# Patient Record
Sex: Female | Born: 2000 | Race: White | Hispanic: No | Marital: Married | Smoking: Former smoker
Health system: Southern US, Community
[De-identification: ages and names within clinical notes are randomized; demographics above are authoritative.]

## PROBLEM LIST (undated history)

## (undated) ENCOUNTER — Emergency Department (HOSPITAL_COMMUNITY): Disposition: A | Discharge status: Home / Self Care | Payer: Medicaid Other

## (undated) DIAGNOSIS — Z87440 Personal history of urinary (tract) infections: Secondary | ICD-10-CM

## (undated) DIAGNOSIS — F419 Anxiety disorder, unspecified: Secondary | ICD-10-CM

## (undated) DIAGNOSIS — R55 Syncope and collapse: Secondary | ICD-10-CM

## (undated) DIAGNOSIS — Z8619 Personal history of other infectious and parasitic diseases: Secondary | ICD-10-CM

## (undated) DIAGNOSIS — I499 Cardiac arrhythmia, unspecified: Secondary | ICD-10-CM

## (undated) HISTORY — DX: Personal history of other infectious and parasitic diseases: Z86.19

## (undated) HISTORY — DX: Syncope and collapse: R55

## (undated) HISTORY — DX: Personal history of urinary (tract) infections: Z87.440

## (undated) HISTORY — PX: OTHER SURGICAL HISTORY: SHX169

---
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
## 2018-01-20 LAB — HM HIV SCREENING LAB: HM HIV Screening: NEGATIVE

## 2018-07-27 ENCOUNTER — Other Ambulatory Visit: Payer: Self-pay

## 2018-07-27 ENCOUNTER — Emergency Department
Admission: EM | Admit: 2018-07-27 | Discharge: 2018-07-27 | Disposition: A | Discharge status: Home / Self Care | Payer: Medicaid Other | Attending: Emergency Medicine | Admitting: Emergency Medicine

## 2018-07-27 ENCOUNTER — Encounter: Payer: Self-pay | Admitting: Intensive Care

## 2018-07-27 ENCOUNTER — Emergency Department
Admission: EM | Admit: 2018-07-27 | Discharge: 2018-07-27 | Disposition: A | Discharge status: Home / Self Care | Payer: Medicaid Other | Source: Home / Self Care | Attending: Emergency Medicine | Admitting: Emergency Medicine

## 2018-07-27 ENCOUNTER — Encounter: Payer: Self-pay | Admitting: Emergency Medicine

## 2018-07-27 DIAGNOSIS — F17290 Nicotine dependence, other tobacco product, uncomplicated: Secondary | ICD-10-CM | POA: Insufficient documentation

## 2018-07-27 DIAGNOSIS — Z79899 Other long term (current) drug therapy: Secondary | ICD-10-CM | POA: Insufficient documentation

## 2018-07-27 DIAGNOSIS — B9689 Other specified bacterial agents as the cause of diseases classified elsewhere: Secondary | ICD-10-CM

## 2018-07-27 DIAGNOSIS — N898 Other specified noninflammatory disorders of vagina: Secondary | ICD-10-CM | POA: Insufficient documentation

## 2018-07-27 DIAGNOSIS — R300 Dysuria: Secondary | ICD-10-CM | POA: Insufficient documentation

## 2018-07-27 DIAGNOSIS — Z5321 Procedure and treatment not carried out due to patient leaving prior to being seen by health care provider: Secondary | ICD-10-CM | POA: Diagnosis not present

## 2018-07-27 DIAGNOSIS — N760 Acute vaginitis: Secondary | ICD-10-CM

## 2018-07-27 LAB — URINALYSIS, COMPLETE (UACMP) WITH MICROSCOPIC
BACTERIA UA: NONE SEEN
Bilirubin Urine: NEGATIVE
GLUCOSE, UA: NEGATIVE mg/dL
KETONES UR: NEGATIVE mg/dL
Leukocytes,Ua: NEGATIVE
NITRITE: NEGATIVE
PROTEIN: NEGATIVE mg/dL
Specific Gravity, Urine: 1.024 (ref 1.005–1.030)
pH: 6 (ref 5.0–8.0)

## 2018-07-27 LAB — CHLAMYDIA/NGC RT PCR (ARMC ONLY)
Chlamydia Tr: DETECTED — AB
N gonorrhoeae: NOT DETECTED

## 2018-07-27 LAB — WET PREP, GENITAL
SPERM: NONE SEEN
Trich, Wet Prep: NONE SEEN
Yeast Wet Prep HPF POC: NONE SEEN

## 2018-07-27 LAB — POCT PREGNANCY, URINE: PREG TEST UR: NEGATIVE

## 2018-07-27 MED ORDER — METRONIDAZOLE 500 MG PO TABS: 500.0000 mg | ORAL_TABLET | Freq: Two times a day (BID) | ORAL | 0 refills | Status: AC

## 2018-07-27 MED ORDER — METRONIDAZOLE 500 MG PO TABS
500.0000 mg | ORAL_TABLET | Freq: Once | ORAL | Status: DC
  Filled 2018-07-27: qty 1

## 2018-07-27 NOTE — ED Triage Notes (Addendum)
Patient c/o lower back pain and brown discharge X3 days. Denies urinary problems. Patient gave urine yesterday. Results in review

## 2018-07-27 NOTE — ED Triage Notes (Signed)
Pt c/o pelvic pain and dark brown, thick vaginal discharge post sexual intercourse 4 days ago.

## 2018-07-27 NOTE — Discharge Instructions (Addendum)
Your exam and labs have confirmed BV. Take the prescription meds as directed. Follow-up with your provider for ongoing symptoms. You may call back for pending lab results.

## 2018-07-27 NOTE — ED Provider Notes (Signed)
[facility] Emergency Department Provider Note ____________________________________________  Time seen: 1750  I have reviewed the triage vital signs and the nursing notes.  HISTORY  Chief Complaint  Back Pain (lower)  HPI [name] is a 18 y.o. female returns to the ED for evaluation of low back pain, low abdominal pain, vaginal discharge.  Patient presented last night but was unable to stay due to the protracted wait.  She presents today with 2 to 3 days of lower abdominal pain as well as some vaginal discharge she describes as brown.  She denies any frank hematuria but has had some urinary frequency and urgency.  She reports subjective fevers, nausea without vomiting, and some mild flank pain.  Patient is sexually active and but denies any current birth control.  She reports her LMP was 1-1/2 weeks prior.  Past Medical History:  Diagnosis Date  . Irregular heartbeat   . Vasovagal syncope     Patient Active Problem List   Diagnosis Date Noted  . Trichomonal vaginitis 12/16/2016  . Preventative health care 12/13/2016  . Tobacco abuse 12/13/2016  . Needs flu shot 07/17/2015  . 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 Heart palpitations 07/17/2015  . Need for HPV vaccination 07/17/2015  . Screening for STD (sexually transmitted disease) 07/17/2015    History reviewed. No pertinent surgical history.  Prior to Admission medications   Medication Sig Start Date End Date Taking? Authorizing Provider  BIOTIN PO Take 500 mcg by mouth 2 (two) times daily.    [provider]  metroNIDAZOLE (FLAGYL) 500 MG tablet Take 1 tablet (500 mg total) by mouth 2 (two) times daily for 7 days. 07/27/18 08/03/18  Tavita Eastham, Charlesetta Ivory, PA-C  Prenatal Vit-Fe Fumarate-FA (PRENATAL MULTIVITAMIN) TABS tablet Take 1 tablet by mouth daily at 12 noon.    [provider]    Allergies Patient has no known allergies.  Family History  Problem Relation Age of Onset  .  Heart disease Paternal Uncle   . Heart disease Paternal Grandfather   . Depression Maternal Grandmother   . COPD Maternal Grandmother   . Schizophrenia Maternal Grandmother   . Lung cancer Maternal Grandfather   . Liver cancer Maternal Grandfather   . Cancer Maternal Grandfather        liver and lung   . Multiple sclerosis Paternal Grandmother     Social History Social History   Tobacco Use  . Smoking status: Current Every Day Smoker    Packs/day: 0.50    Types: Cigars  . Smokeless tobacco: Never Used  Substance Use Topics  . Alcohol use: Yes  . Drug use: No    Types: Marijuana    Review of Systems  Constitutional: Negative for fever. Eyes: Negative for visual changes. ENT: Negative for sore throat. Cardiovascular: Negative for chest pain. Respiratory: Negative for shortness of breath. Gastrointestinal: Positive for lower abdominal pain, and intermittent nausea.  Denies any vomiting and diarrhea. Genitourinary: Positive for dysuria.  Reports vaginal discharge as above. Musculoskeletal: Negative for back pain. Skin: Negative for rash. Neurological: Negative for headaches, focal weakness or numbness. ____________________________________________  PHYSICAL EXAM:  VITAL SIGNS: ED Triage Vitals [07/27/18 1607]  Enc Vitals Group     BP (!) 155/95     Pulse Rate 96     Resp 16     Temp 98.4 F (36.9 C)     Temp Source Oral     SpO2 100 %     Weight 140 lb (63.5  kg)     Height 5\' 10"  (1.778 m)     Head Circumference      Peak Flow      Pain Score 4     Pain Loc      Pain Edu?      Excl. in GC?     Constitutional: Alert and oriented. Well appearing and in no distress. Head: Normocephalic and atraumatic. Eyes: Conjunctivae are normal. Normal extraocular movements Cardiovascular: Normal rate, regular rhythm. Normal distal pulses. Respiratory: Normal respiratory effort. No wheezes/rales/rhonchi. Gastrointestinal: Soft and nontender. No distention. GU: normal  external genitalia. Brownish discharge in the vault. Cervical os is closed with mucoid discharge noted. No CMT or adnexal masses appreciated.  Musculoskeletal: Nontender with normal range of motion in all extremities.  Neurologic:  Normal gait without ataxia. Normal speech and language. No gross focal neurologic deficits are appreciated. Skin:  Skin is warm, dry and intact. No rash noted. ____________________________________________   LABS (pertinent positives/negatives)  Labs Reviewed  WET PREP, GENITAL - Abnormal; Notable for the following components:      Result Value   Clue Cells Wet Prep HPF POC PRESENT (*)    WBC, Wet Prep HPF POC MANY (*)    All other components within normal limits  CHLAMYDIA/NGC RT PCR ([facility] ONLY)  ____________________________________________  PROCEDURES  Procedures ____________________________________________  INITIAL IMPRESSION / ASSESSMENT AND PLAN / ED COURSE  Patient with ED evaluation of dysuria and discharge, presented to the ED for evaluation.  Urinalysis is reassuring at this time.  Patient's wet prep does confirm clue cells.  She will be discharged with a prescription for metronidazole to treat her BV.  She is encouraged to call back in 2 hours to confirm the results of her pending GC test.  ____________________________________________  FINAL CLINICAL IMPRESSION(S) / ED DIAGNOSES  Final diagnoses:  Dysuria  BV (bacterial vaginosis)      Karmen Stabs, Charlesetta Ivory, PA-C 07/27/18 1921    Phineas Semen, MD 07/27/18 2141

## 2018-07-28 ENCOUNTER — Telehealth: Payer: Self-pay | Admitting: Emergency Medicine

## 2018-07-28 NOTE — Telephone Encounter (Addendum)
Called patient to inform of positive chlamydia test and need for treatment.  Mobile number no answer and no voicmail.  Home phone--left message asking her to call me.  Per dr. Mayford Knife can call in azithromycin 1 gram to patient preferred pharmacy.  07/29/2018--tried mobile number again and still no answer and no voicemail.  She has not responded to home phone message.  Will send letter.

## 2018-11-23 ENCOUNTER — Ambulatory Visit: Payer: Medicaid Other

## 2019-11-14 ENCOUNTER — Emergency Department
Admission: EM | Admit: 2019-11-14 | Discharge: 2019-11-14 | Discharge status: Against Medical Advice | Payer: Medicaid Other | Attending: Emergency Medicine | Admitting: Emergency Medicine

## 2019-11-14 ENCOUNTER — Other Ambulatory Visit: Payer: Self-pay

## 2019-11-14 DIAGNOSIS — T40601A Poisoning by unspecified narcotics, accidental (unintentional), initial encounter: Secondary | ICD-10-CM

## 2019-11-14 DIAGNOSIS — F17290 Nicotine dependence, other tobacco product, uncomplicated: Secondary | ICD-10-CM | POA: Insufficient documentation

## 2019-11-14 DIAGNOSIS — T402X1A Poisoning by other opioids, accidental (unintentional), initial encounter: Secondary | ICD-10-CM | POA: Insufficient documentation

## 2019-11-14 DIAGNOSIS — F1110 Opioid abuse, uncomplicated: Secondary | ICD-10-CM

## 2019-11-14 HISTORY — DX: Opioid abuse, uncomplicated: F11.10

## 2019-11-14 LAB — COMPREHENSIVE METABOLIC PANEL
ALT: 14 U/L (ref 0–44)
AST: 18 U/L (ref 15–41)
Albumin: 4.2 g/dL (ref 3.5–5.0)
Alkaline Phosphatase: 48 U/L (ref 38–126)
Anion gap: 8 (ref 5–15)
BUN: 16 mg/dL (ref 6–20)
CO2: 24 mmol/L (ref 22–32)
Calcium: 8.7 mg/dL — ABNORMAL LOW (ref 8.9–10.3)
Chloride: 105 mmol/L (ref 98–111)
Creatinine, Ser: 0.84 mg/dL (ref 0.44–1.00)
GFR calc Af Amer: >60 mL/min (ref >60)
GFR calc non Af Amer: >60 mL/min (ref >60)
Glucose, Bld: 136 mg/dL — ABNORMAL HIGH (ref 70–99)
Potassium: 3.6 mmol/L (ref 3.5–5.1)
Sodium: 137 mmol/L (ref 135–145)
Total Bilirubin: 1.1 mg/dL (ref 0.3–1.2)
Total Protein: 6.5 g/dL (ref 6.5–8.1)

## 2019-11-14 LAB — CBC
HCT: 37.5 % (ref 36.0–46.0)
Hemoglobin: 12.6 g/dL (ref 12.0–15.0)
MCH: 28.6 pg (ref 26.0–34.0)
MCHC: 33.6 g/dL (ref 30.0–36.0)
MCV: 85.2 fL (ref 80.0–100.0)
Platelets: 284 10*3/uL (ref 150–400)
RBC: 4.4 MIL/uL (ref 3.87–5.11)
RDW: 12 % (ref 11.5–15.5)
WBC: 9 10*3/uL (ref 4.0–10.5)
nRBC: 0 % (ref 0.0–0.2)

## 2019-11-14 LAB — ETHANOL: Alcohol, Ethyl (B): <10 mg/dL (ref <10)

## 2019-11-14 NOTE — ED Triage Notes (Signed)
Patient arrived via EMS from gas station right [place] the road from Hospital. Patient overdosed on percocet's, unknown amount. [place] police arrived and gave 2mg  narcan intranasal. EMS arrived and gave 4mg  Zofran and bolus of Normal saline. Patient is AOx4 and ambulatory at this time.  Patient does not want help with drug addiction problem.

## 2019-11-14 NOTE — ED Notes (Signed)
Pt chose to leave AMA.

## 2019-11-14 NOTE — ED Notes (Signed)
This RN spoke with security and they will attempt to locate the pt's boyfriend.

## 2019-11-14 NOTE — ED Notes (Signed)
MD Jessup at bedside to discuss the need for continued care/observation. Pt states she wants to leave unless her ex boyfriend and her dog can be found. Pt has attempted to reach him but states his phone is off. This RN attempted to call security to have parking lot checked without success. Will continue to try to reach security.

## 2019-11-14 NOTE — ED Notes (Signed)
Pt informed that security is looking for her boyfriend. Pt states that she doesn't trust him with her dog and states she just wants to leave. Pt informed of dangers of leaving. Pt stated that "this has happened to me before and I didn't come to the hospital". MD informed and to be discharged AMA. Pt's IV removed by this RN and pt ambulatory to lobby.

## 2019-11-14 NOTE — ED Provider Notes (Signed)
[facility] Emergency Department Provider Note   ____________________________________________   First MD Initiated Contact with Patient 11/14/19 1706     (approximate)  I have reviewed the triage vital signs and the nursing notes.   HISTORY  Chief Complaint Drug Overdose    HPI [name] is a 19 y.o. female with possible history of opiate abuse and vasovagal syncope who presents to the ED for opiate overdose.  Per EMS, patient was found unresponsive in her car at a gas station down the road.  Patient states "I overdosed on Percocet".  She states she has done this multiple times in the past and now feels fine.  She denies any intent to harm herself, has not had any suicidal ideation.  She was given 2 mg of intranasal Narcan by EMS and subsequently woke up, did have some nausea and was treated with 4 mg of Zofran and an IV fluid bolus.  Patient currently denies any complaints, is asking when she can be discharged home.  She denies any alcohol consumption or drug use outside of Percocet.  She snorted the Percocet and is unsure exactly how much she took.        Past Medical History:  Diagnosis Date  . Irregular heartbeat   . Opioid abuse (HCC) 11/14/2019  . Vasovagal syncope     Patient Active Problem List   Diagnosis Date Noted  . Trichomonal vaginitis 12/16/2016  . Preventative health care 12/13/2016  . Tobacco abuse 12/13/2016  . Needs flu shot 07/17/2015  . 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 Heart palpitations 07/17/2015  . Need for HPV vaccination 07/17/2015  . Screening for STD (sexually transmitted disease) 07/17/2015    History reviewed. No pertinent surgical history.  Prior to Admission medications   Not on File    Allergies Patient has no known allergies.  Family History  Problem Relation Age of Onset  . Heart disease Paternal Uncle   . Heart disease Paternal Grandfather   . Depression Maternal Grandmother   . COPD Maternal  Grandmother   . Schizophrenia Maternal Grandmother   . Lung cancer Maternal Grandfather   . Liver cancer Maternal Grandfather   . Cancer Maternal Grandfather        liver and lung   . Multiple sclerosis Paternal Grandmother     Social History Social History   Tobacco Use  . Smoking status: Current Every Day Smoker    Packs/day: 0.50    Types: Cigars  . Smokeless tobacco: Never Used  Vaping Use  . Vaping Use: Former  Substance Use Topics  . Alcohol use: Yes  . Drug use: No    Types: Marijuana    Review of Systems  Constitutional: No fever/chills.  Positive for opiate overdose. Eyes: No visual changes. ENT: No sore throat. Cardiovascular: Denies chest pain. Respiratory: Denies shortness of breath. Gastrointestinal: No abdominal pain.  No nausea, no vomiting.  No diarrhea.  No constipation. Genitourinary: Negative for dysuria. Musculoskeletal: Negative for back pain. Skin: Negative for rash. Neurological: Negative for headaches, focal weakness or numbness.  ____________________________________________   PHYSICAL EXAM:  VITAL SIGNS: ED Triage Vitals [11/14/19 1708]  Enc Vitals Group     BP      Pulse      Resp      Temp      Temp src      SpO2      Weight 134 lb 7.7 oz (61 kg)     Height 5\' 10"  (  1.778 m)     Head Circumference      Peak Flow      Pain Score 0     Pain Loc      Pain Edu?      Excl. in [place]?     Constitutional: Alert and oriented. Eyes: Conjunctivae are normal. Head: Atraumatic. Nose: No congestion/rhinnorhea. Mouth/Throat: Mucous membranes are moist. Neck: Normal ROM Cardiovascular: Normal rate, regular rhythm. Grossly normal heart sounds. Respiratory: Normal respiratory effort.  No retractions. Lungs CTAB. Gastrointestinal: Soft and nontender. No distention. Genitourinary: deferred Musculoskeletal: No lower extremity tenderness nor edema. Neurologic:  Normal speech and language. No gross focal neurologic deficits are  appreciated. Skin:  Skin is warm, dry and intact. No rash noted. Psychiatric: Mood and affect are normal. Speech and behavior are normal.  ____________________________________________   LABS (all labs ordered are listed, but only abnormal results are displayed)  Labs Reviewed  COMPREHENSIVE METABOLIC PANEL - Abnormal; Notable for the following components:      Result Value   Glucose, Bld 136 (*)    Calcium 8.7 (*)    All other components within normal limits  ETHANOL  CBC  URINE DRUG SCREEN, QUALITATIVE ([facility] ONLY)  POC URINE PREG, ED   ____________________________________________  EKG  ED ECG REPORT I, Blake Divine, the attending physician, personally viewed and interpreted this ECG.   Date: 11/14/2019  EKG Time: 17:03  Rate: 101  Rhythm: sinus tachycardia  Axis: Normal  Intervals:none  ST&T Change: None   PROCEDURES  Procedure(s) performed (including Critical Care):  Procedures   ____________________________________________   INITIAL IMPRESSION / ASSESSMENT AND PLAN / ED COURSE       19 year old female with history of opiate abuse presents to the ED following apparent overdose on Percocet, admits to snorting an unknown amount just prior to arrival.  She denies any alcohol consumption or other drug abuse, seems to have responded well to intranasal Narcan is now awake and alert.  We will observe here in the ED for any rebound effect of opiate medication, screening labs thus far are unremarkable.  EKG without evidence of arrhythmia or ischemia.  Patient was initially asking to leave, but is now agreeable to stay for observation.  Patient again expressing desire to leave and I attempted to have a discussion with her, however she eloped from the emergency department prior to doing so.      ____________________________________________   FINAL CLINICAL IMPRESSION(S) / ED DIAGNOSES  Final diagnoses:  Opiate overdose, accidental or unintentional, initial  encounter [facility])     ED Discharge Orders    None       Note:  This document was prepared using Dragon voice recognition software and may include unintentional dictation errors.   Blake Divine, MD 11/14/19 [phone]

## 2019-11-14 NOTE — ED Notes (Signed)
Full rainbow sent to lab.  

## 2020-09-04 ENCOUNTER — Other Ambulatory Visit: Payer: Self-pay

## 2020-09-04 ENCOUNTER — Encounter: Payer: Self-pay | Admitting: Physician Assistant

## 2020-09-04 ENCOUNTER — Ambulatory Visit: Payer: Medicaid Other | Admitting: Physician Assistant

## 2020-09-04 DIAGNOSIS — B3731 Acute candidiasis of vulva and vagina: Secondary | ICD-10-CM

## 2020-09-04 DIAGNOSIS — Z113 Encounter for screening for infections with a predominantly sexual mode of transmission: Secondary | ICD-10-CM

## 2020-09-04 DIAGNOSIS — B373 Candidiasis of vulva and vagina: Secondary | ICD-10-CM

## 2020-09-04 LAB — WET PREP FOR TRICH, YEAST, CLUE
Clue Cell Exam: POSITIVE — AB
Trichomonas Exam: NEGATIVE

## 2020-09-04 MED ORDER — CLOTRIMAZOLE 1 % VA CREA: 1.0000 | TOPICAL_CREAM | Freq: Every day | VAGINAL | 0 refills | Status: AC

## 2020-09-04 NOTE — Progress Notes (Signed)
[facility] Department STI clinic/screening visit  Subjective:  [name] is a 20 y.o. female being seen today for an STI screening visit. The patient reports they do have symptoms.  Patient reports that they do not desire a pregnancy in the next year.   They reported they are not interested in discussing contraception today.  No LMP recorded.   Patient has the following medical conditions:   Patient Active Problem List   Diagnosis Date Noted  . Trichomonal vaginitis 12/16/2016  . Preventative health care 12/13/2016  . Tobacco abuse 12/13/2016  . Needs flu shot 07/17/2015  . 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 Heart palpitations 07/17/2015  . Need for HPV vaccination 07/17/2015  . Screening for STD (sexually transmitted disease) 07/17/2015    Chief Complaint  Patient presents with  . SEXUALLY TRANSMITTED DISEASE    screening    HPI  Patient reports that she has had some slight itching for a few days and a "bloated" feeling.  Denies other symptoms, chronic conditions, surgeries and regular medicines.  Reports that she wants to make sure she does not have Chlamydia again.  LMP 08/31/2020 and normal.  States that she is using condoms as her BCM.  Reports last pap was in 2019 and last HIV test was in 2021.  States that she has stopped using heroin on her own, without going to detox or rehab and that she has abstained for about 4 months.  Declines referral to LCSW and NA today.   See flowsheet for further details and programmatic requirements.    The following portions of the patient's history were reviewed and updated as appropriate: allergies, current medications, past medical history, past social history, past surgical history and problem list.  Objective:  There were no vitals filed for this visit.  Physical Exam Constitutional:      General: She is not in acute distress.    Appearance: Normal appearance.  HENT:     Head: Normocephalic and atraumatic.      Comments: No nits,lice, or hair loss. No cervical, supraclavicular or axillary adenopathy.    Mouth/Throat:     Mouth: Mucous membranes are moist.     Pharynx: Oropharynx is clear. No oropharyngeal exudate or posterior oropharyngeal erythema.  Eyes:     Conjunctiva/sclera: Conjunctivae normal.  Pulmonary:     Effort: Pulmonary effort is normal.  Abdominal:     Palpations: Abdomen is soft. There is no mass.     Tenderness: There is no abdominal tenderness. There is no guarding or rebound.  Genitourinary:    General: Normal vulva.     Rectum: Normal.     Comments: External genitalia/pubic area without nits, lice, edema, erythema, lesions and inguinal adenopathy. Vagina with normal mucosa and discharge. Cervix without visible lesions. Uterus firm, mobile, nt, no masses, no CMT, no adnexal tenderness or fullness. Musculoskeletal:     Cervical back: Neck supple. No tenderness.  Skin:    General: Skin is warm and dry.     Findings: No bruising, erythema, lesion or rash.  Neurological:     Mental Status: She is alert and oriented to person, place, and time.  Psychiatric:        Mood and Affect: Mood normal.        Behavior: Behavior normal.        Thought Content: Thought content normal.        Judgment: Judgment normal.      Assessment and Plan:  [name] is  a 20 y.o. female presenting to the [facility] Department for STI screening  1. Screening for STD (sexually transmitted disease) Patient into clinic with symptoms. Reviewed with patient that wet mount shows few yeast and patient requests treatment. Rec condoms with all sex. Await test results.  Counseled that RN will call if needs to RTC for treatment once results are back. Patient declines referral to LCSW and NA, but accepts LCSW and [employer] cards and resource sheet with NA info on it. Patient also declines Narcan kit today, but counseled to RTC for same if she changes her mind. - WET PREP FOR TRICH,  YEAST, CLUE - Chlamydia/Gonorrhea [place] [facility] - HIV/HCV [place] [facility] - HBV Antigen/Antibody [facility] - Syphilis Serology, [place] [facility] - [place] [facility]  2. Candida vaginitis Will treat yeast with Clotrimazole 1% vaginal cream 1 app qhs for 7 days. No sex for 10 days. - clotrimazole (CLOTRIMAZOLE-7) 1 % vaginal cream; Place 1 Applicatorful vaginally at bedtime for 7 days.  Dispense: 45 g; Refill: 0     No follow-ups on file.  No future appointments.  Jerene Dilling, PA

## 2020-09-08 LAB — HM HIV SCREENING LAB: HM HIV Screening: NEGATIVE

## 2020-09-08 LAB — HM HEPATITIS C SCREENING LAB: HM Hepatitis Screen: NEGATIVE

## 2020-09-08 LAB — HEPATITIS B SURFACE ANTIGEN

## 2020-09-11 ENCOUNTER — Telehealth: Payer: Self-pay | Admitting: Family Medicine

## 2020-09-11 NOTE — Telephone Encounter (Signed)
Pt called wanting to know test results from previous visit.

## 2020-11-09 ENCOUNTER — Ambulatory Visit (LOCAL_COMMUNITY_HEALTH_CENTER): Payer: Medicaid Other

## 2020-11-09 ENCOUNTER — Other Ambulatory Visit: Payer: Self-pay

## 2020-11-09 VITALS — BP 134/85 | Ht 70.0 in | Wt 147.5 lb

## 2020-11-09 DIAGNOSIS — Z3201 Encounter for pregnancy test, result positive: Secondary | ICD-10-CM

## 2020-11-09 LAB — PREGNANCY, URINE: Preg Test, Ur: POSITIVE — AB

## 2020-11-09 NOTE — Progress Notes (Signed)
Client undecided regarding prenatal care provider [facility] or [facility]?). PHQ-P score = 11 and client declined offer of provider consult. Jossie Ng, RN

## 2020-11-17 ENCOUNTER — Ambulatory Visit: Payer: Medicaid Other

## 2020-11-26 ENCOUNTER — Emergency Department
Admission: EM | Admit: 2020-11-26 | Discharge: 2020-11-26 | Disposition: A | Discharge status: Home / Self Care | Payer: Medicaid Other | Attending: Emergency Medicine | Admitting: Emergency Medicine

## 2020-11-26 ENCOUNTER — Encounter: Payer: Self-pay | Admitting: Emergency Medicine

## 2020-11-26 ENCOUNTER — Emergency Department: Payer: Medicaid Other

## 2020-11-26 ENCOUNTER — Other Ambulatory Visit: Payer: Self-pay

## 2020-11-26 DIAGNOSIS — Z87891 Personal history of nicotine dependence: Secondary | ICD-10-CM | POA: Diagnosis not present

## 2020-11-26 DIAGNOSIS — O4691 Antepartum hemorrhage, unspecified, first trimester: Secondary | ICD-10-CM | POA: Diagnosis not present

## 2020-11-26 DIAGNOSIS — O418X10 Other specified disorders of amniotic fluid and membranes, first trimester, not applicable or unspecified: Secondary | ICD-10-CM

## 2020-11-26 DIAGNOSIS — Z3A01 Less than 8 weeks gestation of pregnancy: Secondary | ICD-10-CM | POA: Diagnosis not present

## 2020-11-26 DIAGNOSIS — N9489 Other specified conditions associated with female genital organs and menstrual cycle: Secondary | ICD-10-CM | POA: Diagnosis not present

## 2020-11-26 DIAGNOSIS — O209 Hemorrhage in early pregnancy, unspecified: Secondary | ICD-10-CM

## 2020-11-26 DIAGNOSIS — O208 Other hemorrhage in early pregnancy: Secondary | ICD-10-CM | POA: Diagnosis present

## 2020-11-26 LAB — URINALYSIS, COMPLETE (UACMP) WITH MICROSCOPIC
Bacteria, UA: NONE SEEN
Bilirubin Urine: NEGATIVE
Glucose, UA: NEGATIVE mg/dL
Ketones, ur: NEGATIVE mg/dL
Leukocytes,Ua: NEGATIVE
Nitrite: NEGATIVE
Protein, ur: NEGATIVE mg/dL
Specific Gravity, Urine: 1.01 (ref 1.005–1.030)
WBC, UA: NONE SEEN WBC/hpf (ref 0–5)
pH: 6 (ref 5.0–8.0)

## 2020-11-26 LAB — COMPREHENSIVE METABOLIC PANEL
ALT: 13 U/L (ref 0–44)
AST: 17 U/L (ref 15–41)
Albumin: 4.4 g/dL (ref 3.5–5.0)
Alkaline Phosphatase: 52 U/L (ref 38–126)
Anion gap: 9 (ref 5–15)
BUN: 9 mg/dL (ref 6–20)
CO2: 24 mmol/L (ref 22–32)
Calcium: 9.8 mg/dL (ref 8.9–10.3)
Chloride: 102 mmol/L (ref 98–111)
Creatinine, Ser: 0.57 mg/dL (ref 0.44–1.00)
GFR, Estimated: >60 mL/min (ref >60)
Glucose, Bld: 86 mg/dL (ref 70–99)
Potassium: 3.8 mmol/L (ref 3.5–5.1)
Sodium: 135 mmol/L (ref 135–145)
Total Bilirubin: 0.9 mg/dL (ref 0.3–1.2)
Total Protein: 7.1 g/dL (ref 6.5–8.1)

## 2020-11-26 LAB — CBC
HCT: 39.9 % (ref 36.0–46.0)
Hemoglobin: 13.6 g/dL (ref 12.0–15.0)
MCH: 29.4 pg (ref 26.0–34.0)
MCHC: 34.1 g/dL (ref 30.0–36.0)
MCV: 86.4 fL (ref 80.0–100.0)
Platelets: 329 10*3/uL (ref 150–400)
RBC: 4.62 MIL/uL (ref 3.87–5.11)
RDW: 11.7 % (ref 11.5–15.5)
WBC: 10.9 10*3/uL — ABNORMAL HIGH (ref 4.0–10.5)
nRBC: 0.4 % — ABNORMAL HIGH (ref 0.0–0.2)

## 2020-11-26 LAB — POC URINE PREG, ED: Preg Test, Ur: POSITIVE — AB

## 2020-11-26 LAB — ABO/RH: ABO/RH(D): O POS

## 2020-11-26 LAB — HCG, QUANTITATIVE, PREGNANCY: hCG, Beta Chain, Quant, S: 88811 m[IU]/mL — ABNORMAL HIGH (ref <5)

## 2020-11-26 NOTE — ED Notes (Signed)
Pt in [place]

## 2020-11-26 NOTE — ED Triage Notes (Signed)
Pt via POV from home. Pt c/o vaginal spotting since last night. Denies any other sym. Pt is about [redacted] weeks pregnant. LMP 5/22. Pt is A&Ox4 and NAD.

## 2020-11-26 NOTE — ED Notes (Signed)
E signature pad not working. Pt educated on discharge instructions and verbalized understanding.  

## 2020-11-28 ENCOUNTER — Emergency Department
Admission: EM | Admit: 2020-11-28 | Discharge: 2020-11-28 | Disposition: A | Discharge status: Home / Self Care | Payer: Medicaid Other | Attending: Emergency Medicine | Admitting: Emergency Medicine

## 2020-11-28 ENCOUNTER — Other Ambulatory Visit: Payer: Self-pay

## 2020-11-28 DIAGNOSIS — Z5321 Procedure and treatment not carried out due to patient leaving prior to being seen by health care provider: Secondary | ICD-10-CM | POA: Insufficient documentation

## 2020-11-28 DIAGNOSIS — R1110 Vomiting, unspecified: Secondary | ICD-10-CM | POA: Insufficient documentation

## 2020-11-28 NOTE — ED Triage Notes (Signed)
Pt is about [redacted] weeks pregnant has been having issues with emesis during pregnancy but states she tried to go to the [facility] today but because she had a fever they told her she needed a covid test.

## 2020-11-28 NOTE — ED Triage Notes (Signed)
First Nurse Note:  Arrives from [facility] c/o vomiting x 2 days.

## 2020-11-30 NOTE — ED Provider Notes (Signed)
[facility] Emergency Department Provider Note ____________________________________________   Event Date/Time   First MD Initiated Contact with Patient 11/26/20 1713     (approximate)  I have reviewed the triage vital signs and the nursing notes.   HISTORY  Chief Complaint Vaginal Bleeding  HPI [name] is a 20 y.o. female with no significant past medical history presents to the emergency department for treatment and evaluation of spotting during first trimester pregnancy.  She is approximately 7 weeks.  She denies any abdominal pain or cramping.  She is gravida 1 para 0.  She has also had some emesis but is tolerating fluids today.        Past Medical History:  Diagnosis Date   History of chlamydia    treated   History of trichomoniasis    treated   History of urinary tract infection    Irregular heartbeat    Opioid abuse (HCC) 11/14/2019   Vasovagal syncope     Patient Active Problem List   Diagnosis Date Noted   Trichomonal vaginitis 12/16/2016   Preventative health care 12/13/2016   Tobacco abuse 12/13/2016   Needs flu shot 07/17/2015   Vasovagal syncope 07/17/2015   Heart palpitations 07/17/2015   Need for HPV vaccination 07/17/2015   Screening for STD (sexually transmitted disease) 07/17/2015    History reviewed. No pertinent surgical history.  Prior to Admission medications   Medication Sig Start Date End Date Taking? Authorizing Provider  Prenatal Vit-Fe Fumarate-FA (MULTIVITAMIN-PRENATAL) 27-0.8 MG TABS tablet Take 1 tablet by mouth daily at 12 noon.    [provider]    Allergies Patient has no known allergies.  Family History  Problem Relation Age of Onset   Heart disease Paternal Grandfather    Multiple sclerosis Paternal Grandmother    Depression Maternal Grandmother    COPD Maternal Grandmother    Schizophrenia Maternal Grandmother    Lung cancer Maternal Grandfather    Liver cancer Maternal  Grandfather    Cancer Maternal Grandfather        liver and lung     Social History Social History   Tobacco Use   Smoking status: Former    Packs/day: 1.00    Years: 6.00    Pack years: 6.00    Types: Cigars, Cigarettes   Smokeless tobacco: Never   Tobacco comments:    Last tobacco use second week of 10/2020.  Vaping Use   Vaping Use: Every day   Substances: Nicotine  Substance Use Topics   Alcohol use: Yes    Comment: Last ETOH use 11/07/20 (found out pregnant 11/08/20).   Drug use: Yes    Types: Marijuana, Heroin, Fentanyl    Comment: Reports has not used heroin for 4 months as of 09/04/2020. Per client at 11/09/2020 UPT appt at [facility], states last heroin use was 05/2020. Last Fentanyl use 05/2020. Last Percocet and Xanax use 05/2020. Last marijuana use 10/2020.    Review of Systems  Constitutional: No fever/chills Eyes: No visual changes. ENT: No sore throat. Cardiovascular: Denies chest pain. Respiratory: Denies shortness of breath. Gastrointestinal: No abdominal pain.  Positive for nausea, no vomiting.  No diarrhea.  No constipation. Genitourinary: Negative for dysuria.  Positive for spotting Musculoskeletal: Negative for back pain. Skin: Negative for rash. Neurological: Negative for headaches, focal weakness or numbness. ____________________________________________   PHYSICAL EXAM:  VITAL SIGNS: ED Triage Vitals  Enc Vitals Group     BP 11/26/20 1453 135/89     Pulse  Rate 11/26/20 1453 83     Resp 11/26/20 1453 20     Temp 11/26/20 1453 98.1 F (36.7 C)     Temp Source 11/26/20 1840 Oral     SpO2 11/26/20 1453 100 %     Weight 11/26/20 1454 140 lb (63.5 kg)     Height 11/26/20 1454 5\' 9"  (1.753 m)     Head Circumference --      Peak Flow --      Pain Score 11/26/20 1453 0     Pain Loc --      Pain Edu? --      Excl. in GC? --     Constitutional: Alert and oriented. Well appearing and in no acute distress. Eyes: Conjunctivae are normal. PERRL. EOMI. Head:  Atraumatic. Nose: No congestion/rhinnorhea. Mouth/Throat: Mucous membranes are moist.  Oropharynx non-erythematous. Neck: No stridor.   Hematological/Lymphatic/Immunilogical: No cervical lymphadenopathy. Cardiovascular: Normal rate, regular rhythm. Grossly normal heart sounds.  Good peripheral circulation. Respiratory: Normal respiratory effort.  No retractions. Lungs CTAB. Gastrointestinal: Soft and nontender. No distention. No abdominal bruits. No CVA tenderness. Genitourinary:  Musculoskeletal: No lower extremity tenderness nor edema.  No joint effusions. Neurologic:  Normal speech and language. No gross focal neurologic deficits are appreciated. No gait instability. Skin:  Skin is warm, dry and intact. No rash noted. Psychiatric: Mood and affect are normal. Speech and behavior are normal.  ____________________________________________   LABS (all labs ordered are listed, but only abnormal results are displayed)  Labs Reviewed  CBC - Abnormal; Notable for the following components:      Result Value   WBC 10.9 (*)    nRBC 0.4 (*)    All other components within normal limits  URINALYSIS, COMPLETE (UACMP) WITH MICROSCOPIC - Abnormal; Notable for the following components:   Color, Urine STRAW (*)    APPearance CLEAR (*)    Hgb urine dipstick MODERATE (*)    All other components within normal limits  HCG, QUANTITATIVE, PREGNANCY - Abnormal; Notable for the following components:   hCG, Beta Chain, Quant, S 01/27/21 (*)    All other components within normal limits  POC URINE PREG, ED - Abnormal; Notable for the following components:   Preg Test, Ur POSITIVE (*)    All other components within normal limits  COMPREHENSIVE METABOLIC PANEL  ABO/RH   ____________________________________________  EKG  Not indicated ____________________________________________  RADIOLOGY  ED MD interpretation:    Ultrasound shows pregnancy of 6 weeks and 4 days.  There is a small subchorionic  hemorrhage.  Cardiac activity is visualized with a fetal heart rate of 123.  Official radiology report(s): No results found.  ____________________________________________   PROCEDURES  Procedure(s) performed (including Critical Care):  Procedures  ____________________________________________   INITIAL IMPRESSION / ASSESSMENT AND PLAN     20 year old female presenting to the emergency department for treatment and evaluation of vaginal spotting.  See HPI for further details.   DIFFERENTIAL DIAGNOSIS  Vaginal bleeding first trimester, subchorionic hemorrhage, missed abortion  ED COURSE  Ultrasound shows a single IUP with FHR of 123 at 6 weeks and 4 days.  Patient is O+ therefore will not need RhoGAM.  She will be discharged home to follow up with her gynecologist. She is to return to the ER for symptoms that change or worsen if unable to see the gynecologist.    ___________________________________________   FINAL CLINICAL IMPRESSION(S) / ED DIAGNOSES  Final diagnoses:  Vaginal bleeding in pregnancy, first trimester  Subchorionic hematoma in  first trimester, single or unspecified fetus     ED Discharge Orders     None        Tiffine L Nicolls was evaluated in Emergency Department on 11/30/2020 for the symptoms described in the history of present illness. She was evaluated in the context of the global COVID-19 pandemic, which necessitated consideration that the patient might be at risk for infection with the SARS-CoV-2 virus that causes COVID-19. Institutional protocols and algorithms that pertain to the evaluation of patients at risk for COVID-19 are in a state of rapid change based on information released by regulatory bodies including the CDC and federal and state organizations. These policies and algorithms were followed during the patient's care in the ED.   Note:  This document was prepared using Dragon voice recognition software and may include unintentional  dictation errors.    Chinita Pester, FNP 11/30/20 1917    Sharman Cheek, MD 12/03/20 [phone]

## 2021-06-28 ENCOUNTER — Observation Stay
Admission: EM | Admit: 2021-06-28 | Discharge: 2021-06-28 | Disposition: A | Discharge status: Home / Self Care | Payer: Medicaid Other | Attending: Certified Nurse Midwife | Admitting: Certified Nurse Midwife

## 2021-06-28 ENCOUNTER — Encounter: Payer: Self-pay | Admitting: Obstetrics and Gynecology

## 2021-06-28 DIAGNOSIS — Z3A37 37 weeks gestation of pregnancy: Secondary | ICD-10-CM | POA: Insufficient documentation

## 2021-06-28 DIAGNOSIS — O368130 Decreased fetal movements, third trimester, not applicable or unspecified: Principal | ICD-10-CM | POA: Diagnosis present

## 2021-06-28 NOTE — OB Triage Note (Signed)
Discharge instructions, labor precautions, and follow-up care reviewed with patient and significant other. All questions answered. Patient verbalized understanding. Patient discharged ambulatory off unit.

## 2021-06-28 NOTE — Discharge Summary (Signed)
Patient ID: [name] MRN: [id number] DOB/AGE: 23/23/2002 23 y.o.  Admit date: 06/28/2021 Discharge date: 06/28/2021  Admission Diagnoses: Decreased Fetal Movement  Discharge Diagnoses: Reactive NST  Factors complicating pregnancy: 1. Unstable Fetal Lie (most recent position breech) 2. 03/26/21 Itching of Palms 3. COVID during pregnancy 4. History of substance use  Prenatal Procedures: NST  Consults: None  Significant Diagnostic Studies:  No results found for this or any previous visit (from the past 168 hour(s)).  Treatments: none  Hospital Course:  This is a 21 y.o. G1P0 with IUP at [redacted]w[redacted]d seen for decreased fetal movement.  No leaking of fluid and no bleeding.  She was observed, fetal heart rate monitoring remained reassuring, and she had no signs/symptoms of labor or other maternal-fetal concerns.  Her cervical exam was unchanged from admission.  She was deemed stable for discharge to home with outpatient follow up.  Discharge Physical Exam:  BP 122/77    Pulse (!) 103    Temp 99 F (37.2 C) (Oral)    Resp 19    Ht 5\' 9"  (1.753 m)    Wt 88.9 kg    LMP 09/29/2020 (Within Days)    BMI 28.94 kg/m   NST: FHR baseline: 145 bpm Variability: moderate Accelerations: yes Decelerations: none Category/reactivity: reactive   TOCO: quiet SVE: deferred      Discharge Condition: Stable  Disposition: Discharge disposition: 01-Home or Self Care        Allergies as of 06/28/2021   No Known Allergies      Medication List    You have not been prescribed any medications.      Signed4/01/2022, CNM 07/06/2021 8:04 PM

## 2021-06-28 NOTE — OB Triage Note (Signed)
Patient is a G1P0 at [redacted]w[redacted]d who presents to unit c/o decreased fetal movement for the past 3 hours. She reports baby has been moving but "lightly" not as much as before. Denies ctx, vaginal bleeding, and LOF. External monitors applied and assessing. Initial FHT 155.

## 2021-07-04 NOTE — H&P (Signed)
[name] is a 21 y.o. female presenting for scheduled c/s for unstable lie .40+[redacted] weeks EGA   Unstable Fetal Lie (most recent position breech) 06/22/21: breech via [place], provided with breech resources 06/26/21: appointment with TJS to discuss ECV vs primary c-section 06/07/21: confirmed vertex [place] 05/24/21: transverse, head left 03/26/21 Itching of Palms Biles acids drawn 11/7 - 1.9 (nl) COVID during pregnancy Covid infection in 1st trimester  3rd trimester growth u/s 05/24/21 05/24/21: EFW 2010g (58%), AFI 17.38cm (64%), Position: transverse, head left Weekly NST at 36 weeks History of substance use Substance choice: Heroin - last use 09/04/2020  Fentanyl - 05/2020  Percocet - 05/2020 Xanax - 05/2020 THC - 10/2020 Stopped tobacco use in the beginning of pregnancy Antepartum screening  12/26/2020 - UDS negative   UDS on admission to L&D   Screening results and needs: NOB: 12/26/2020 Medicaid Questionnaire:  []  [place]  [facility] program  Depression Score: MBT: O pos   Ab screen:  Negative  Pap:  12/26/2020 NILM G/C: Neg/NEg HIV:neg  Hep B/RPR:     Rubella: Immune    VZV: Immune  TSH: 1.714 HgA1C: 5.2 Aneuploidy:  First trimester:  MaternitT21: Negative    Second trimester (AFP/tetra): Negative  28 weeks:  Review Medicaid Questionnaire: Depression Score:12 Blood consent:Signed 04/19/21 Hgb: 11.9  Platelets: 218    Glucola: 168  Rhogam: n/a 3 hr GTT: 71, 216, 119, 74 36 weeks:  14/1/22   G/C:Negative/Negative    Hgb:12.1      Platelets:248      HIV/RPR:Negative/NR    Last KAJ:GOTLXBWI:  Done in ED 11/26/20 Ultrasound shows pregnancy of 6 weeks and 4 days. There is a small subchorionic hemorrhage. Cardiac activity is visualized with a fetal heart rate of 123. 01/01/21: Single, viable IUP, s=[redacted]w[redacted]d. FHT=172 BPM. Yolk Sac and Amnion imaged. Cervical length=4.16cm. No Free fluid seen. No subchorionic hemorrhage seen. Lt corpus luteum cyst=1.9cm. rt ovary appears wnl 02/23/21: Normal anatomy seen Single,  viable IUP, S=[redacted]w[redacted]d FHR=153bpm Cervical length=4.82cm B/L ovaries appear wnl Placenta=anterior Position=breech 05/24/21: EFW= 4lb7oz(2010g)= 58% FHR=130bpm AFI= 17.38cm @ 64% Placenta=anterior Position=transverse, head lt 06/07/21: position=vertex 06/22/21: position=breech Immunization:   Flu in season - Given 01/30/21 me Tdap at 27-36 weeks - Given 04/19/21 me Contraception Plan:  Feeding Plan:   OB History     Gravida  1   Para      Term      Preterm      AB      Living         SAB      IAB      Ectopic      Multiple      Live Births             Past Medical History:  Diagnosis Date   History of chlamydia    treated   History of trichomoniasis    treated   History of urinary tract infection    Irregular heartbeat    Opioid abuse (HCC) 11/14/2019   Vasovagal syncope    No past surgical history on file. Family History: family history includes COPD in her maternal grandmother; Cancer in her maternal grandfather; Depression in her maternal grandmother; Heart disease in her paternal grandfather; Liver cancer in her maternal grandfather; Lung cancer in her maternal grandfather; Multiple sclerosis in her paternal grandmother; Schizophrenia in her maternal grandmother. Social History:  reports that she has quit smoking. Her smoking use included cigars and cigarettes. She has a 6.00 pack-year smoking  history. She has never used smokeless tobacco. She reports current alcohol use. She reports current drug use. Drugs: Marijuana, Heroin, and Fentanyl.       Review of Systems Review of Systems: A full review of systems was performed and negative except as noted in the HPI.   Eyes: no vision change  Ears: left ear pain  Oropharynx: no sore throat  Pulmonary . No shortness of breath , no hemoptysis Cardiovascular: no chest pain , no irregular heart beat  Gastrointestinal:no blood in stool . No diarrhea, no constipation Uro gynecologic: no dysuria , no pelvic  pain Neurologic : no seizure , no migraines    Musculoskeletal: no muscular weakness  History   Last menstrual period 09/29/2020. Exam Physical Exam 120/80   Lungs CTA   CV RRR   Abd gravid  Prenatal labs: ABO, Rh: --/--/O POS Performed at [facility], [address] [place], [place] [address]  [phone]) Antibody:  neg Rubella:  Imm, Vz Imm RPR:   NR HBsAg: Hep B surface antigen non-reactive, Hep B surface Antibody non-reactive (04/22 0000)  HIV:   neg  GBS:   neg   Assessment/Plan: Scheduled LTCS on 07/17/21 Risk discussed - see [facility] notes    Ihor Austin Lania Zawistowski 07/04/2021, 4:45 PM

## 2021-07-06 ENCOUNTER — Other Ambulatory Visit: Payer: Medicaid Other

## 2021-07-09 ENCOUNTER — Encounter
Admission: RE | Admit: 2021-07-09 | Discharge: 2021-07-09 | Disposition: A | Discharge status: Home / Self Care | Payer: Medicaid Other | Source: Ambulatory Visit | Attending: Obstetrics and Gynecology | Admitting: Obstetrics and Gynecology

## 2021-07-09 ENCOUNTER — Other Ambulatory Visit: Payer: Self-pay

## 2021-07-09 HISTORY — DX: Anxiety disorder, unspecified: F41.9

## 2021-07-09 NOTE — Patient Instructions (Addendum)
Your procedure is scheduled on: 07/17/21 Enter thru the Emergency Dept at 5:30 am to be taken to [facility] [phone] call for any questions  Remember: Instructions that are not followed completely may result in serious medical risk, up to and including death, or upon the discretion of your surgeon and anesthesiologist your surgery may need to be rescheduled.     _X__ 1. Do not eat food or drink any liquids after midnight the night before your procedure.                 No gum chewing or hard candies.   __X__2.  On the morning of surgery brush your teeth with toothpaste and water, you                 may rinse your mouth with mouthwash if you wish.  Do not swallow any              toothpaste of mouthwash.     _X__ 3.  No Alcohol for 24 hours before or after surgery.   _X__ 4.  Do Not Smoke or use e-cigarettes For 24 Hours Prior to Your Surgery.                 Do not use any chewable tobacco products for at least 6 hours prior to                 surgery.  ____  5.  Bring all medications with you on the day of surgery if instructed.   __X__  6.  Notify your doctor if there is any change in your medical condition      (cold, fever, infections).     Do not wear jewelry, make-up, hairpins, clips or nail polish. Do not wear lotions, powders, or perfumes.  Do not shave body hair 48 hours prior to surgery. Men may shave face and neck. Do not bring valuables to the hospital.    [facility] is not responsible for any belongings or valuables.  Contacts, dentures/partials or body piercings may not be worn into surgery. Bring a case for your contacts, glasses or hearing aids, a denture cup will be supplied. Leave your suitcase in the car. After surgery it may be brought to your room. For patients admitted to the hospital, discharge time is determined by your treatment team.   Patients discharged the day of surgery will not be allowed to drive home.   Please read over the  following fact sheets that you were given:   CHG soap  __X__ Take these medicines the morning of surgery with A SIP OF WATER:    1. none  2.   3.   4.  5.  6.  ____ Fleet Enema (as directed)   __X__ Use CHG Soap/SAGE wipes as directed avoid using on breasts also if planning to breast feed  ____ Use inhalers on the day of surgery  ____ Stop metformin/Janumet/Farxiga 2 days prior to surgery    ____ Take 1/2 of usual insulin dose the night before surgery. No insulin the morning          of surgery.   ____ Stop Blood Thinners Coumadin/Plavix/Xarelto/Pleta/Pradaxa/Eliquis/Effient/Aspirin  on   Or contact your Surgeon, Cardiologist or Medical Doctor regarding  ability to stop your blood thinners  __X__ Stop Anti-inflammatories 7 days before surgery such as Advil, Ibuprofen, Motrin,  BC or Goodies Powder, Naprosyn, Naproxen, Aleve, Aspirin    __X__ Stop all herbals and supplements,  fish oil or vitamins  until after surgery.    ____ Bring C-Pap to the hospital.

## 2021-07-16 ENCOUNTER — Other Ambulatory Visit: Payer: Self-pay

## 2021-07-16 ENCOUNTER — Other Ambulatory Visit
Admission: RE | Admit: 2021-07-16 | Discharge: 2021-07-16 | Disposition: A | Discharge status: Home / Self Care | Payer: Medicaid Other | Source: Ambulatory Visit | Attending: Obstetrics and Gynecology | Admitting: Obstetrics and Gynecology

## 2021-07-16 DIAGNOSIS — Z20822 Contact with and (suspected) exposure to covid-19: Secondary | ICD-10-CM | POA: Insufficient documentation

## 2021-07-16 DIAGNOSIS — Z01812 Encounter for preprocedural laboratory examination: Secondary | ICD-10-CM | POA: Insufficient documentation

## 2021-07-16 LAB — CBC
HCT: 36.2 % (ref 36.0–46.0)
Hemoglobin: 11.8 g/dL — ABNORMAL LOW (ref 12.0–15.0)
MCH: 27.4 pg (ref 26.0–34.0)
MCHC: 32.6 g/dL (ref 30.0–36.0)
MCV: 84 fL (ref 80.0–100.0)
Platelets: 280 10*3/uL (ref 150–400)
RBC: 4.31 MIL/uL (ref 3.87–5.11)
RDW: 12.6 % (ref 11.5–15.5)
WBC: 9.1 10*3/uL (ref 4.0–10.5)
nRBC: 0 % (ref 0.0–0.2)

## 2021-07-16 LAB — SARS CORONAVIRUS 2 (TAT 6-24 HRS): SARS Coronavirus 2: NEGATIVE

## 2021-07-16 LAB — BASIC METABOLIC PANEL
Anion gap: 13 (ref 5–15)
BUN: 11 mg/dL (ref 6–20)
CO2: 18 mmol/L — ABNORMAL LOW (ref 22–32)
Calcium: 9.2 mg/dL (ref 8.9–10.3)
Chloride: 105 mmol/L (ref 98–111)
Creatinine, Ser: 0.6 mg/dL (ref 0.44–1.00)
GFR, Estimated: >60 mL/min (ref >60)
Glucose, Bld: 114 mg/dL — ABNORMAL HIGH (ref 70–99)
Potassium: 3.4 mmol/L — ABNORMAL LOW (ref 3.5–5.1)
Sodium: 136 mmol/L (ref 135–145)

## 2021-07-16 MED ORDER — ACETAMINOPHEN 500 MG PO TABS
1000.0000 mg | ORAL_TABLET | Freq: Once | ORAL | Status: AC
  Administered 2021-07-17: 1000 mg via ORAL
  Filled 2021-07-16: qty 2

## 2021-07-16 MED ORDER — SOD CITRATE-CITRIC ACID 500-334 MG/5ML PO SOLN: 30.0000 mL | ORAL | Status: AC

## 2021-07-16 MED ORDER — LACTATED RINGERS IV SOLN: Freq: Once | INTRAVENOUS | Status: DC

## 2021-07-16 MED ORDER — LACTATED RINGERS IV SOLN: INTRAVENOUS | Status: DC

## 2021-07-16 MED ORDER — CEFAZOLIN SODIUM-DEXTROSE 2-4 GM/100ML-% IV SOLN
2.0000 g | INTRAVENOUS | Status: AC
  Administered 2021-07-17: 2 g via INTRAVENOUS
  Filled 2021-07-16: qty 100

## 2021-07-16 MED ORDER — GABAPENTIN 300 MG PO CAPS
300.0000 mg | ORAL_CAPSULE | Freq: Once | ORAL | Status: AC
  Administered 2021-07-17: 300 mg via ORAL
  Filled 2021-07-16: qty 1

## 2021-07-16 NOTE — Anesthesia Preprocedure Evaluation (Addendum)
Anesthesia Evaluation  Patient identified by MRN, date of birth, ID band Patient awake    Reviewed: Allergy & Precautions, NPO status , Patient's Chart, lab work & pertinent test results  History of Anesthesia Complications Negative for: history of anesthetic complications  Airway Mallampati: I  TM Distance: >3 FB Neck ROM: full    Dental  (+) Dental Advidsory Given   Pulmonary neg pulmonary ROS, former smoker,  COVID during pregnancy   Pulmonary exam normal        Cardiovascular Exercise Tolerance: Good negative cardio ROS Normal cardiovascular exam     Neuro/Psych PSYCHIATRIC DISORDERS Anxiety negative neurological ROS     GI/Hepatic negative GI ROS, Neg liver ROS,   Endo/Other    Renal/GU negative Renal ROS  negative genitourinary   Musculoskeletal   Abdominal   Peds  Hematology negative hematology ROS (+)   Anesthesia Other Findings History of substance use  Past Medical History: No date: Anxiety No date: History of chlamydia     Comment:  treated No date: History of trichomoniasis     Comment:  treated No date: History of urinary tract infection No date: Irregular heartbeat 11/14/2019: Opioid abuse (HCC) No date: Vasovagal syncope  No past surgical history on file.     Reproductive/Obstetrics (+) Pregnancy Breech                            Anesthesia Physical Anesthesia Plan  ASA: 2  Anesthesia Plan: Spinal   Post-op Pain Management:    Induction:   PONV Risk Score and Plan:   Airway Management Planned:   Additional Equipment:   Intra-op Plan:   Post-operative Plan:   Informed Consent:   Plan Discussed with: Anesthesiologist  Anesthesia Plan Comments:        Anesthesia Quick Evaluation

## 2021-07-17 ENCOUNTER — Encounter
Admission: RE | Disposition: A | Discharge status: Home / Self Care | Payer: Medicaid Other | Source: Home / Self Care | Attending: Obstetrics and Gynecology

## 2021-07-17 ENCOUNTER — Inpatient Hospital Stay: Payer: Medicaid Other | Admitting: Anesthesiology

## 2021-07-17 ENCOUNTER — Encounter: Payer: Self-pay | Admitting: Obstetrics and Gynecology

## 2021-07-17 ENCOUNTER — Inpatient Hospital Stay
Admission: RE | Admit: 2021-07-17 | Discharge: 2021-07-19 | DRG: 787 | Disposition: A | Discharge status: Home / Self Care | Payer: Medicaid Other | Attending: Obstetrics and Gynecology | Admitting: Obstetrics and Gynecology

## 2021-07-17 DIAGNOSIS — Z20822 Contact with and (suspected) exposure to covid-19: Secondary | ICD-10-CM | POA: Diagnosis present

## 2021-07-17 DIAGNOSIS — Z87891 Personal history of nicotine dependence: Secondary | ICD-10-CM

## 2021-07-17 DIAGNOSIS — Z3A40 40 weeks gestation of pregnancy: Secondary | ICD-10-CM

## 2021-07-17 DIAGNOSIS — Z9889 Other specified postprocedural states: Secondary | ICD-10-CM

## 2021-07-17 DIAGNOSIS — D62 Acute posthemorrhagic anemia: Secondary | ICD-10-CM | POA: Diagnosis not present

## 2021-07-17 DIAGNOSIS — O9081 Anemia of the puerperium: Secondary | ICD-10-CM | POA: Diagnosis not present

## 2021-07-17 DIAGNOSIS — O321XX0 Maternal care for breech presentation, not applicable or unspecified: Principal | ICD-10-CM | POA: Diagnosis present

## 2021-07-17 DIAGNOSIS — Z8616 Personal history of COVID-19: Secondary | ICD-10-CM | POA: Diagnosis not present

## 2021-07-17 DIAGNOSIS — O320XX0 Maternal care for unstable lie, not applicable or unspecified: Secondary | ICD-10-CM | POA: Diagnosis present

## 2021-07-17 LAB — CBC
HCT: 33.8 % — ABNORMAL LOW (ref 36.0–46.0)
Hemoglobin: 11.3 g/dL — ABNORMAL LOW (ref 12.0–15.0)
MCH: 27.5 pg (ref 26.0–34.0)
MCHC: 33.4 g/dL (ref 30.0–36.0)
MCV: 82.2 fL (ref 80.0–100.0)
Platelets: 260 10*3/uL (ref 150–400)
RBC: 4.11 MIL/uL (ref 3.87–5.11)
RDW: 12.6 % (ref 11.5–15.5)
WBC: 19.1 10*3/uL — ABNORMAL HIGH (ref 4.0–10.5)
nRBC: 0 % (ref 0.0–0.2)

## 2021-07-17 LAB — URINE DRUG SCREEN, QUALITATIVE (ARMC ONLY)
Amphetamines, Ur Screen: NOT DETECTED
Barbiturates, Ur Screen: NOT DETECTED
Benzodiazepine, Ur Scrn: NOT DETECTED
Cannabinoid 50 Ng, Ur ~~LOC~~: NOT DETECTED
Cocaine Metabolite,Ur ~~LOC~~: NOT DETECTED
MDMA (Ecstasy)Ur Screen: NOT DETECTED
Methadone Scn, Ur: NOT DETECTED
Opiate, Ur Screen: NOT DETECTED
Phencyclidine (PCP) Ur S: NOT DETECTED
Tricyclic, Ur Screen: NOT DETECTED

## 2021-07-17 LAB — CREATININE, SERUM
Creatinine, Ser: 0.59 mg/dL (ref 0.44–1.00)
GFR, Estimated: >60 mL/min (ref >60)

## 2021-07-17 SURGERY — Surgical Case
Anesthesia: Spinal

## 2021-07-17 MED ORDER — LIDOCAINE HCL (PF) 1 % IJ SOLN
INTRAMUSCULAR | Status: DC | PRN
  Administered 2021-07-17: 3 mL via SUBCUTANEOUS

## 2021-07-17 MED ORDER — DIPHENHYDRAMINE HCL 25 MG PO CAPS: 25.0000 mg | ORAL_CAPSULE | ORAL | Status: DC | PRN

## 2021-07-17 MED ORDER — PRENATAL MULTIVITAMIN CH
1.0000 | ORAL_TABLET | Freq: Every day | ORAL | Status: DC
  Administered 2021-07-17 – 2021-07-19 (×3): 1 via ORAL
  Filled 2021-07-17 (×3): qty 1

## 2021-07-17 MED ORDER — DIBUCAINE (PERIANAL) 1 % EX OINT: 1.0000 "application " | TOPICAL_OINTMENT | CUTANEOUS | Status: DC | PRN

## 2021-07-17 MED ORDER — NALOXONE HCL 0.4 MG/ML IJ SOLN: 0.4000 mg | INTRAMUSCULAR | Status: DC | PRN

## 2021-07-17 MED ORDER — KETOROLAC TROMETHAMINE 30 MG/ML IJ SOLN: 30.0000 mg | Freq: Four times a day (QID) | INTRAMUSCULAR | Status: AC | PRN

## 2021-07-17 MED ORDER — ENOXAPARIN SODIUM 40 MG/0.4ML IJ SOSY
40.0000 mg | PREFILLED_SYRINGE | INTRAMUSCULAR | Status: DC
  Administered 2021-07-18 – 2021-07-19 (×2): 40 mg via SUBCUTANEOUS
  Filled 2021-07-17 (×3): qty 0.4

## 2021-07-17 MED ORDER — TETANUS-DIPHTH-ACELL PERTUSSIS 5-2.5-18.5 LF-MCG/0.5 IM SUSY
0.5000 mL | PREFILLED_SYRINGE | Freq: Once | INTRAMUSCULAR | Status: DC
  Filled 2021-07-17: qty 0.5

## 2021-07-17 MED ORDER — MORPHINE SULFATE (PF) 0.5 MG/ML IJ SOLN
INTRAMUSCULAR | Status: AC
  Filled 2021-07-17: qty 10

## 2021-07-17 MED ORDER — MORPHINE SULFATE (PF) 0.5 MG/ML IJ SOLN
INTRAMUSCULAR | Status: DC | PRN
  Administered 2021-07-17: .1 mg via INTRATHECAL

## 2021-07-17 MED ORDER — SODIUM CHLORIDE (PF) 0.9 % IJ SOLN
INTRAMUSCULAR | Status: AC
  Filled 2021-07-17: qty 50

## 2021-07-17 MED ORDER — KETOROLAC TROMETHAMINE 30 MG/ML IJ SOLN
30.0000 mg | Freq: Four times a day (QID) | INTRAMUSCULAR | Status: AC
  Administered 2021-07-17 – 2021-07-18 (×4): 30 mg via INTRAVENOUS
  Filled 2021-07-17 (×4): qty 1

## 2021-07-17 MED ORDER — ONDANSETRON HCL 4 MG/2ML IJ SOLN: 4.0000 mg | Freq: Three times a day (TID) | INTRAMUSCULAR | Status: DC | PRN

## 2021-07-17 MED ORDER — ZOLPIDEM TARTRATE 5 MG PO TABS: 5.0000 mg | ORAL_TABLET | Freq: Every evening | ORAL | Status: DC | PRN

## 2021-07-17 MED ORDER — LACTATED RINGERS IV BOLUS
500.0000 mL | Freq: Once | INTRAVENOUS | Status: AC
  Administered 2021-07-17: 500 mL via INTRAVENOUS

## 2021-07-17 MED ORDER — IBUPROFEN 600 MG PO TABS
600.0000 mg | ORAL_TABLET | Freq: Four times a day (QID) | ORAL | Status: DC
  Administered 2021-07-18 – 2021-07-19 (×4): 600 mg via ORAL
  Filled 2021-07-17 (×4): qty 1

## 2021-07-17 MED ORDER — KETOROLAC TROMETHAMINE 30 MG/ML IJ SOLN: 30.0000 mg | Freq: Four times a day (QID) | INTRAMUSCULAR | Status: DC

## 2021-07-17 MED ORDER — PHENYLEPHRINE HCL-NACL 20-0.9 MG/250ML-% IV SOLN
INTRAVENOUS | Status: DC | PRN
  Administered 2021-07-17: 50 ug/min via INTRAVENOUS

## 2021-07-17 MED ORDER — MEPERIDINE HCL 25 MG/ML IJ SOLN: 6.2500 mg | INTRAMUSCULAR | Status: DC | PRN

## 2021-07-17 MED ORDER — OXYCODONE HCL 5 MG PO TABS
5.0000 mg | ORAL_TABLET | ORAL | Status: DC | PRN
  Filled 2021-07-17: qty 1

## 2021-07-17 MED ORDER — FENTANYL CITRATE (PF) 100 MCG/2ML IJ SOLN
INTRAMUSCULAR | Status: DC | PRN
Start: 2021-07-17 — End: 2021-07-17
  Administered 2021-07-17: 15 ug via INTRATHECAL

## 2021-07-17 MED ORDER — ONDANSETRON HCL 4 MG/2ML IJ SOLN
INTRAMUSCULAR | Status: DC | PRN
  Administered 2021-07-17: 4 mg via INTRAVENOUS

## 2021-07-17 MED ORDER — OXYCODONE HCL 5 MG PO TABS
5.0000 mg | ORAL_TABLET | ORAL | Status: DC | PRN
  Administered 2021-07-18 – 2021-07-19 (×2): 5 mg via ORAL
  Filled 2021-07-17: qty 1

## 2021-07-17 MED ORDER — ONDANSETRON HCL 4 MG/2ML IJ SOLN: 4.0000 mg | Freq: Once | INTRAMUSCULAR | Status: DC | PRN

## 2021-07-17 MED ORDER — BUPIVACAINE HCL (PF) 0.5 % IJ SOLN
INTRAMUSCULAR | Status: AC
  Filled 2021-07-17: qty 10

## 2021-07-17 MED ORDER — SIMETHICONE 80 MG PO CHEW
80.0000 mg | CHEWABLE_TABLET | ORAL | Status: DC | PRN
  Administered 2021-07-18: 80 mg via ORAL
  Filled 2021-07-17: qty 1

## 2021-07-17 MED ORDER — OXYTOCIN-SODIUM CHLORIDE 30-0.9 UT/500ML-% IV SOLN
INTRAVENOUS | Status: AC
  Filled 2021-07-17: qty 1000

## 2021-07-17 MED ORDER — SOD CITRATE-CITRIC ACID 500-334 MG/5ML PO SOLN
ORAL | Status: AC
  Administered 2021-07-17: 30 mL via ORAL
  Filled 2021-07-17: qty 15

## 2021-07-17 MED ORDER — KETOROLAC TROMETHAMINE 30 MG/ML IJ SOLN: 30.0000 mg | Freq: Once | INTRAMUSCULAR | Status: DC

## 2021-07-17 MED ORDER — SIMETHICONE 80 MG PO CHEW
80.0000 mg | CHEWABLE_TABLET | Freq: Three times a day (TID) | ORAL | Status: DC
  Administered 2021-07-17 – 2021-07-19 (×7): 80 mg via ORAL
  Filled 2021-07-17 (×7): qty 1

## 2021-07-17 MED ORDER — KETOROLAC TROMETHAMINE 30 MG/ML IJ SOLN
INTRAMUSCULAR | Status: AC
  Filled 2021-07-17: qty 1

## 2021-07-17 MED ORDER — BUPIVACAINE IN DEXTROSE 0.75-8.25 % IT SOLN
INTRATHECAL | Status: DC | PRN
  Administered 2021-07-17: 1.6 mL via INTRATHECAL

## 2021-07-17 MED ORDER — COCONUT OIL OIL
1.0000 "application " | TOPICAL_OIL | Status: DC | PRN
  Filled 2021-07-17: qty 120

## 2021-07-17 MED ORDER — SODIUM CHLORIDE 0.9% FLUSH: 3.0000 mL | INTRAVENOUS | Status: DC | PRN

## 2021-07-17 MED ORDER — IBUPROFEN 600 MG PO TABS: 600.0000 mg | ORAL_TABLET | Freq: Four times a day (QID) | ORAL | Status: DC

## 2021-07-17 MED ORDER — BUPIVACAINE HCL (PF) 0.5 % IJ SOLN
INTRAMUSCULAR | Status: AC
  Filled 2021-07-17: qty 60

## 2021-07-17 MED ORDER — BUPIVACAINE LIPOSOME 1.3 % IJ SUSP
INTRAMUSCULAR | Status: AC
  Filled 2021-07-17: qty 20

## 2021-07-17 MED ORDER — GABAPENTIN 300 MG PO CAPS
300.0000 mg | ORAL_CAPSULE | Freq: Every day | ORAL | Status: DC
  Administered 2021-07-17 – 2021-07-18 (×2): 300 mg via ORAL
  Filled 2021-07-17 (×2): qty 1

## 2021-07-17 MED ORDER — OXYTOCIN-SODIUM CHLORIDE 30-0.9 UT/500ML-% IV SOLN: 2.5000 [IU]/h | INTRAVENOUS | Status: AC

## 2021-07-17 MED ORDER — FENTANYL CITRATE (PF) 100 MCG/2ML IJ SOLN
INTRAMUSCULAR | Status: AC
  Filled 2021-07-17: qty 2

## 2021-07-17 MED ORDER — MORPHINE SULFATE (PF) 2 MG/ML IV SOLN: 1.0000 mg | INTRAVENOUS | Status: DC | PRN

## 2021-07-17 MED ORDER — KETOROLAC TROMETHAMINE 30 MG/ML IJ SOLN
INTRAMUSCULAR | Status: DC | PRN
  Administered 2021-07-17: 30 mg via INTRAVENOUS

## 2021-07-17 MED ORDER — OXYTOCIN-SODIUM CHLORIDE 30-0.9 UT/500ML-% IV SOLN
INTRAVENOUS | Status: DC | PRN
  Administered 2021-07-17: 30 [IU] via INTRAVENOUS

## 2021-07-17 MED ORDER — MENTHOL 3 MG MT LOZG
1.0000 | LOZENGE | OROMUCOSAL | Status: DC | PRN
  Filled 2021-07-17: qty 9

## 2021-07-17 MED ORDER — DIPHENHYDRAMINE HCL 25 MG PO CAPS: 25.0000 mg | ORAL_CAPSULE | Freq: Four times a day (QID) | ORAL | Status: DC | PRN

## 2021-07-17 MED ORDER — FENTANYL CITRATE (PF) 100 MCG/2ML IJ SOLN: 25.0000 ug | INTRAMUSCULAR | Status: DC | PRN

## 2021-07-17 MED ORDER — ACETAMINOPHEN 500 MG PO TABS
1000.0000 mg | ORAL_TABLET | Freq: Four times a day (QID) | ORAL | Status: DC
  Administered 2021-07-17 – 2021-07-18 (×3): 1000 mg via ORAL
  Filled 2021-07-17 (×3): qty 2

## 2021-07-17 MED ORDER — DIPHENHYDRAMINE HCL 50 MG/ML IJ SOLN: 12.5000 mg | INTRAMUSCULAR | Status: DC | PRN

## 2021-07-17 MED ORDER — SENNOSIDES-DOCUSATE SODIUM 8.6-50 MG PO TABS
2.0000 | ORAL_TABLET | Freq: Every day | ORAL | Status: DC
  Administered 2021-07-18 – 2021-07-19 (×2): 2 via ORAL
  Filled 2021-07-17 (×2): qty 2

## 2021-07-17 MED ORDER — WITCH HAZEL-GLYCERIN EX PADS: 1.0000 "application " | MEDICATED_PAD | CUTANEOUS | Status: DC | PRN

## 2021-07-17 MED ORDER — NALOXONE HCL 4 MG/10ML IJ SOLN
1.0000 ug/kg/h | INTRAVENOUS | Status: DC | PRN
  Filled 2021-07-17: qty 5

## 2021-07-17 MED ORDER — BUPIVACAINE HCL (PF) 0.25 % IJ SOLN
INTRAMUSCULAR | Status: DC | PRN
  Administered 2021-07-17: 60 mL

## 2021-07-17 SURGICAL SUPPLY — 29 items
BACTOSHIELD CHG 4% 4OZ (MISCELLANEOUS) ×1
BARRIER ADHS 3X4 INTERCEED (GAUZE/BANDAGES/DRESSINGS) ×3 IMPLANT
CHLORAPREP W/TINT 26 (MISCELLANEOUS) ×2 IMPLANT
DRSG TELFA 3X8 NADH (GAUZE/BANDAGES/DRESSINGS) ×2 IMPLANT
ELECT CAUTERY BLADE 6.4 (BLADE) ×2 IMPLANT
ELECT REM PT RETURN 9FT ADLT (ELECTROSURGICAL) ×2
ELECTRODE REM PT RTRN 9FT ADLT (ELECTROSURGICAL) ×1 IMPLANT
GAUZE SPONGE 4X4 12PLY STRL (GAUZE/BANDAGES/DRESSINGS) ×2 IMPLANT
GLOVE SURG SYN 8.0 (GLOVE) ×2 IMPLANT
GLOVE SURG SYN 8.0 PF PI (GLOVE) ×1 IMPLANT
GOWN STRL REUS W/ TWL LRG LVL3 (GOWN DISPOSABLE) ×2 IMPLANT
GOWN STRL REUS W/ TWL XL LVL3 (GOWN DISPOSABLE) ×1 IMPLANT
GOWN STRL REUS W/TWL LRG LVL3 (GOWN DISPOSABLE) ×2
GOWN STRL REUS W/TWL XL LVL3 (GOWN DISPOSABLE) ×1
MANIFOLD NEPTUNE II (INSTRUMENTS) ×2 IMPLANT
MAT PREVALON FULL STRYKER (MISCELLANEOUS) ×2 IMPLANT
NEEDLE HYPO 22GX1.5 SAFETY (NEEDLE) ×2 IMPLANT
NS IRRIG 1000ML POUR BTL (IV SOLUTION) ×2 IMPLANT
PACK C SECTION AR (MISCELLANEOUS) ×2 IMPLANT
PAD DRESSING TELFA 3X8 NADH (GAUZE/BANDAGES/DRESSINGS) ×1 IMPLANT
PAD OB MATERNITY 4.3X12.25 (PERSONAL CARE ITEMS) ×2 IMPLANT
PAD PREP 24X41 OB/GYN DISP (PERSONAL CARE ITEMS) ×2 IMPLANT
SCRUB CHG 4% DYNA-HEX 4OZ (MISCELLANEOUS) ×1 IMPLANT
STRAP SAFETY 5IN WIDE (MISCELLANEOUS) ×2 IMPLANT
SUT CHROMIC 1 CTX 36 (SUTURE) ×6 IMPLANT
SUT PLAIN GUT 0 (SUTURE) ×4 IMPLANT
SUT VIC AB 0 CT1 36 (SUTURE) ×4 IMPLANT
SYR 30ML LL (SYRINGE) ×4 IMPLANT
WATER STERILE IRR 500ML POUR (IV SOLUTION) ×2 IMPLANT

## 2021-07-17 NOTE — Discharge Summary (Signed)
Obstetrical Discharge Summary ? ?Patient Name: [name] ?DOB: 06-18-2000 ?MRN: [id number] ? ?Date of Admission: 07/17/2021 ?Date of Delivery: 07/17/21 ?Delivered by: Huel Cote MD ?Date of Discharge: 07/19/2021 ? ?Primary OB: [facility] [id number] last menstrual period was 09/29/2020 (within days). ?EDC Estimated Date of Delivery: 07/16/21 ?Gestational Age at Delivery: [redacted]w[redacted]d  ? ?Antepartum complications: Unstable Fetal Lie (most recent position breech) ?06/22/21: breech via [place], provided with breech resources ?06/26/21: appointment with TJS to discuss ECV vs primary c-section ?06/07/21: confirmed vertex [place] ?05/24/21: transverse, head left ?03/26/21 Itching of Palms ?Biles acids drawn 11/7 - 1.9 (nl) ?COVID during pregnancy ?Covid infection in 1st trimester  ?3rd trimester growth u/s 05/24/21 ?05/24/21: EFW 2010g (58%), AFI 17.38cm (64%), Position: transverse, head left ?Weekly NST at 36 weeks ?History of substance use ?Substance choice: ?Heroin - last use 09/04/2020  ?Fentanyl - 05/2020  Percocet - 05/2020 ?Xanax - 05/2020 ?THC - 10/2020 ?Stopped tobacco use in the beginning of pregnancy ?Antepartum screening  ?12/26/2020 - UDS negative   ?UDS  ?Admitting Diagnosis: breech ?Secondary Diagnosis: ?Patient Active Problem List  ? Diagnosis Date Noted  ? Unstable fetal lie 07/17/2021  ? Postoperative state 07/17/2021  ? Decreased fetal movement affecting management of pregnancy in third trimester 06/28/2021  ? Trichomonal vaginitis 12/16/2016  ? Preventative health care 12/13/2016  ? Tobacco abuse 12/13/2016  ? Needs flu shot 07/17/2015  ? Vasovagal syncope 07/17/2015  ? Heart palpitations 07/17/2015  ? Need for HPV vaccination 07/17/2015  ? Screening for STD (sexually transmitted disease) 07/17/2015  ? ? ?Augmentation: N/A ?Complications: None ?Intrapartum complications/course:  ?Date of Delivery:  ?Delivered By: Huel Cote MD ?Delivery Type: repeat cesarean section, low transverse incision ?Anesthesia:  epidural ?Placenta: spontaneous ?Laceration:  ?Episiotomy: none ?Newborn Data: ?Live born female  ?Birth Weight: 8 lb 6.8 oz (3820 g) ?APGAR: 8, 8 ? ?Newborn Delivery   ?Birth date/time: 07/17/2021 08:15:00 ?Delivery type: C-Section, Low Transverse ?Trial of labor: No ?C-section categorization: Primary ?  ?  ? ?Postpartum Procedures:  none ? ?Edinburgh:  ?Flavia Shipper Postnatal Depression Scale Screening Tool 07/17/2021 07/17/2021  ?I have been able to laugh and see the funny side of things. 0 (No Data)  ?I have looked forward with enjoyment to things. 0 -  ?I have blamed myself unnecessarily when things went wrong. 0 -  ?I have been anxious or worried for no good reason. 0 -  ?I have felt scared or panicky for no good reason. 0 -  ?Things have been getting on top of me. 0 -  ?I have been so unhappy that I have had difficulty sleeping. 0 -  ?I have felt sad or miserable. 0 -  ?I have been so unhappy that I have been crying. 0 -  ?The thought of harming myself has occurred to me. 0 -  ?Edinburgh Postnatal Depression Scale Total 0 -  ? ? ?Post partum course:   Patient had an uncomplicated postpartum course.  By time of discharge on POD#2, her pain was controlled on oral pain medications; she had appropriate lochia and was ambulating, voiding without difficulty, tolerating regular diet and passing flatus.   She was deemed stable for discharge to home.   ? ?Discharge Physical Exam:   ?BP 130/81 (BP Location: Left Arm)   Pulse 77   Temp 97.8 ?F (36.6 ?C) (Oral)   Resp 18   Ht 5\' 9"  (1.753 m)   Wt 90.3 kg   LMP 09/29/2020 (Within Days)   SpO2 100%  Breastfeeding Unknown   BMI 29.39 kg/m?  ? ?General: NAD ?CV: RRR ?Pulm: CTABL, nl effort ?ABD: s/nd/nt, fundus firm and below the umbilicus ?Lochia: moderate ?Incision: c/d/i ?DVT Evaluation: LE non-ttp, no evidence of DVT on exam. ? ?Hemoglobin  ?Date Value Ref Range Status  ?07/18/2021 10.6 (L) 12.0 - 15.0 g/dL Final  ?07/17/2015 14.6 11.1 - 15.9 g/dL Final  ? ?HCT   ?Date Value Ref Range Status  ?07/18/2021 31.7 (L) 36.0 - 46.0 % Final  ? ?Hematocrit  ?Date Value Ref Range Status  ?07/17/2015 42.6 34.0 - 46.6 % Final  ? ? ? ?Disposition: stable, discharge to home. ?Baby Feeding: breastmilk and formula ?Baby Disposition: home with mom ? ?Rh Immune globulin given: O pos ?Rubella vaccine given: immune ?Varicella Vaccine given: immune ?Tdap vaccine given in AP or PP setting: 04/19/21 ?Flu vaccine given in AP or PP setting: 01/30/21 ? ?Contraception: OCPs ? ?Prenatal Labs:  ?ABO, Rh: --/--/O POS ?Performed at [facility], [address] [place], [place] [address] ? [phone]) ?Antibody:  neg ?Rubella:  Imm, Vz Imm ?RPR:   NR ?HBsAg: Hep B surface antigen non-reactive, Hep B surface Antibody non-reactive (04/22 0000)  ?HIV:   neg  ?GBS:   neg  ? ? ?Plan:  ?Hortencia Conradi was discharged to home in good condition. ?Follow-up appointment with delivering provider in 6 weeks. ? ?Discharge Medications: ?Allergies as of 07/19/2021   ?No Known Allergies ?  ? ?  ?Medication List  ?  ? ?TAKE these medications   ? ?acetaminophen 500 MG tablet ?Commonly known as: TYLENOL ?Take 2 tablets (1,000 mg total) by mouth every 6 (six) hours. ?  ?calcium carbonate 500 MG chewable tablet ?Commonly known as: TUMS - dosed in mg elemental calcium ?Chew 1 tablet by mouth daily as needed for indigestion or heartburn. ?  ?ferrous sulfate 325 (65 FE) MG tablet ?Take 1 tablet (325 mg total) by mouth 2 (two) times daily with a meal. For anemia, take with Vitamin C ?  ?gabapentin 300 MG capsule ?Commonly known as: NEURONTIN ?Take 1 capsule (300 mg total) by mouth 2 (two) times daily. ?  ?ibuprofen 600 MG tablet ?Commonly known as: ADVIL ?Take 1 tablet (600 mg total) by mouth every 6 (six) hours. ?  ?oxyCODONE 5 MG immediate release tablet ?Commonly known as: Oxy IR/ROXICODONE ?Take 1 tablet (5 mg total) by mouth every 4 (four) hours as needed for up to 10 doses for severe pain or breakthrough pain. ?   ?prenatal multivitamin Tabs tablet ?Take 1 tablet by mouth daily at 12 noon. ?  ?simethicone 80 MG chewable tablet ?Commonly known as: MYLICON ?Chew 1 tablet (80 mg total) by mouth 3 (three) times daily after meals. ?  ? ?  ? ? ? Follow-up Information   ? ? Schermerhorn, Gwen Her, MD Follow up in 2 week(s).   ?Specialty: Obstetrics and Gynecology ?Why: incision check ?Contact information: ?[address] ?[facility] ?[place] [address] ?[phone] ? ? ?  ?  ? ? Schermerhorn, Gwen Her, MD Follow up in 6 week(s).   ?Specialty: Obstetrics and Gynecology ?Why: 6wk postpartum ?Contact information: ?[address] ?[facility] ?[place] [address] ?[phone] ? ? ?  ?  ? ?  ?  ? ?  ? ? ?Signed: ?Gertie Fey, CNM ?07/19/2021 ?10:34 AM ? ?

## 2021-07-17 NOTE — Op Note (Signed)
[name], [name] MEDICAL RECORD NO: [id number] ACCOUNT NO: [id number] DATE OF BIRTH: 12-31-2000 FACILITY: [facility] LOCATION: [facility] PHYSICIAN: Suzy Bouchard, MD  Operative Report   DATE OF PROCEDURE: 07/17/2021  DATE OF PROCEDURE:  07/17/2021.  PREOPERATIVE DIAGNOSIS:  1. A 40+1 weeks estimated gestational age. 2. Breech presentation.  POSTOPERATIVE DIAGNOSES:  1.  A 40+1 weeks estimated gestational age. 2.  Breech presentation. 3.  Vigorous female, delivered.  PROCEDURES:   1. Primary low transverse cesarean section. 2. Breech extraction.  ANESTHESIA:  Spinal.  SURGEON:  Suzy Bouchard, MD  FIRST ASSISTANT:  [name], certified nurse midwife.  INDICATIONS:  A 21 year old gravida 1, para 0, patient at 40+1 weeks estimated gestational age with a known unstable lie.  The patient has elected for primary cesarean section. Breech presentation confirmed via ultrasound on the day of the procedure.  DESCRIPTION OF PROCEDURE:  After adequate spinal anesthesia, the patient was placed in dorsal supine position, hip roll onto the right side.  The patient's abdomen was prepped and draped in normal sterile fashion.  Pfannenstiel incision was made 2  fingerbreadths above the symphysis pubis.  Sharp dissection was used to identify the fascia.  Fascia was opened in transverse fashion bilaterally.  The superior aspect of the fascia was grasped with Kocher clamps and the recti muscles were dissected  free.  Inferior aspect of the fascia was grasped with Kocher clamps and pyramidalis muscle was dissected free.  The peritoneal cavity was opened sharply.  The vesicouterine peritoneal fold was identified and bladder flap was created and the bladder was  reflected inferiorly.  Low transverse uterine incision was made.  Upon entry into the endometrial cavity, clear fluid resulted.  The incision was extended with blunt transverse traction.  Fetal legs were identified and  grabbed and draped in a wound towel  and the buttocks and torso were delivered followed by delivery of the left arm in a medial sweeping maneuver followed by delivery of the head.  Vigorous female was then dried on the abdomen for 60 seconds and cord was doubly clamped and passed to the  nursery staff.  A female infant, Apgars 8 and 8, fetal weight 3820 grams.  The placenta was manually delivered and the uterus was exteriorized.  Endometrial cavity was wiped clean with laparotomy tape and the uterine incision was closed with #1 chromic  suture in a running locking fashion.  Good approximation of edges.  Good hemostasis noted.  Fallopian tubes and ovaries appeared normal.  Posterior cul-de-sac was irrigated and the uterus was placed back into the abdominal cavity and the pericolic  gutters were wiped clean with laparotomy tape.  Uterine incision again appeared hemostatic.  Interceed was placed over the uterine incision in a T-shaped fashion.  Fascia was then closed using 0 Vicryl suture in a running nonlocking fashion.  Good  approximation of edges.  Good hemostasis noted.  Fascial edges were then injected with a solution of 60 mL of 0.5% Marcaine plus 20 mL normal saline, approximately 45 mL of the solution was injected at the fascial edges.  Subcutaneous tissues were  irrigated and bovied for hemostasis and the skin was reapproximated with Insorb absorbable staples with good cosmetic effect. Additional 30 mL of Marcaine solution was injected beneath the skin and sterile dressing applied.  There were no complications.   Quantitative blood loss 120.  URINE OUTPUT:  50 mL.  INTRAOPERATIVE FLUIDS:  600 mL.  CONDITION: The patient tolerated the procedure well and was  taken to recovery room in good condition.   MUK D: 07/17/2021 9:10:20 am T: 07/17/2021 9:21:00 am  JOB: [id number]/ [id number]

## 2021-07-17 NOTE — Progress Notes (Addendum)
Urine output postop has been inadequate. 5mL 2 hours postop. Pt has been tolerating PO fluids and drank approximately of water, LR Bolus given and pt had a urine output of after bolus was initiated. Schermerhorn, MD aware. Urine output adequate at this point. No new orders. Pt will be transferred to [facility]. Report given to Vickie Epley

## 2021-07-17 NOTE — Transfer of Care (Signed)
Immediate Anesthesia Transfer of Care Note  Patient: [name]  Procedure(s) Performed: CESAREAN SECTION  Patient Location: PACU and Mother/Baby  Anesthesia Type:Spinal  Level of Consciousness: awake, alert  and oriented  Airway & Oxygen Therapy: Patient Spontanous Breathing  Post-op Assessment: Report given to RN and Post -op Vital signs reviewed and stable  Post vital signs: Reviewed and stable  Last Vitals:  Vitals Value Taken Time  BP 110/64 07/17/2021 0850  Temp    Pulse 101   Resp 20   SpO2 98     Last Pain:  Vitals:   07/17/21 0713  TempSrc:   PainSc: 0-No pain         Complications: No notable events documented.

## 2021-07-17 NOTE — Anesthesia Procedure Notes (Signed)
Spinal  Patient location during procedure: OR Start time: 07/17/2021 7:50 AM End time: 07/17/2021 7:54 AM Reason for block: surgical anesthesia Staffing Performed: resident/CRNA  Anesthesiologist: Martha Clan, MD Resident/CRNA: Jerrye Noble, CRNA Preanesthetic Checklist Completed: patient identified, IV checked, site marked, risks and benefits discussed, surgical consent, monitors and equipment checked, pre-op evaluation and timeout performed Spinal Block Patient position: sitting Prep: ChloraPrep Patient monitoring: heart rate, continuous pulse ox, blood pressure and cardiac monitor Approach: midline Location: L3-4 Injection technique: single-shot Needle Needle type: Introducer and Pencan  Needle gauge: 24 G Needle length: 9 cm Assessment Sensory level: T10 Events: CSF return Additional Notes Sterile aseptic technique used throughout the procedure.  Negative paresthesia. Negative blood return. Positive free-flowing CSF. Expiration date of kit checked and confirmed. Patient tolerated procedure well, without complications.

## 2021-07-17 NOTE — Brief Op Note (Signed)
07/17/2021  8:40 AM  PATIENT:  [name]  21 y.o. female  PRE-OPERATIVE DIAGNOSIS:  Breech  POST-OPERATIVE DIAGNOSIS:  Breech  PROCEDURE:  Procedure(s): CESAREAN SECTION (N/A) LTCS SURGEON:  Surgeon(s) and Role:    * Tandre Conly, Ihor Austin, MD - Primary  PHYSICIAN ASSISTANT: jennifer Oxley  ASSISTANTS: none   ANESTHESIA:   spinal  EBL:  QBL : 120 cc   BLOOD ADMINISTERED:none  DRAINS: Urinary Catheter (Foley)   LOCAL MEDICATIONS USED:  MARCAINE    None Source of Specimen:  N/A  DISPOSITION OF SPECIMEN:  N/A  COUNTS:  YES  TOURNIQUET:  * No tourniquets in log *  DICTATION: .Other Dictation: Dictation Number verbal  PLAN OF CARE: Admit to inpatient   PATIENT DISPOSITION:  PACU - hemodynamically stable.   Delay start of Pharmacological VTE agent (>24hrs) due to surgical blood loss or risk of bleeding: yes

## 2021-07-17 NOTE — Progress Notes (Signed)
Here for primary LTCS . Breech presentation . Reconfirmed position today with bedside u/s . NPO . Labs reviewed .All questions answered . Proceed

## 2021-07-18 LAB — CBC
HCT: 31.7 % — ABNORMAL LOW (ref 36.0–46.0)
Hemoglobin: 10.6 g/dL — ABNORMAL LOW (ref 12.0–15.0)
MCH: 27.9 pg (ref 26.0–34.0)
MCHC: 33.4 g/dL (ref 30.0–36.0)
MCV: 83.4 fL (ref 80.0–100.0)
Platelets: 215 10*3/uL (ref 150–400)
RBC: 3.8 MIL/uL — ABNORMAL LOW (ref 3.87–5.11)
RDW: 12.8 % (ref 11.5–15.5)
WBC: 9.7 10*3/uL (ref 4.0–10.5)
nRBC: 0 % (ref 0.0–0.2)

## 2021-07-18 MED ORDER — CALCIUM CARBONATE ANTACID 500 MG PO CHEW
1.0000 | CHEWABLE_TABLET | ORAL | Status: DC | PRN
  Administered 2021-07-18: 400 mg via ORAL
  Filled 2021-07-18: qty 2

## 2021-07-18 MED ORDER — ACETAMINOPHEN 500 MG PO TABS
1000.0000 mg | ORAL_TABLET | Freq: Four times a day (QID) | ORAL | Status: DC
  Administered 2021-07-18 – 2021-07-19 (×5): 1000 mg via ORAL
  Filled 2021-07-18 (×5): qty 2

## 2021-07-18 NOTE — Anesthesia Postprocedure Evaluation (Signed)
Anesthesia Post Note ? ?Patient: [name] ? ?Procedure(s) Performed: CESAREAN SECTION ? ?Patient location during evaluation: Mother Baby ?Anesthesia Type: Spinal ?Level of consciousness: oriented and awake and alert ?Pain management: pain level controlled ?Vital Signs Assessment: post-procedure vital signs reviewed and stable ?Respiratory status: spontaneous breathing and respiratory function stable ?Cardiovascular status: blood pressure returned to baseline and stable ?Postop Assessment: no headache, no backache, no apparent nausea or vomiting and able to ambulate ?Anesthetic complications: no ? ? ?No notable events documented. ? ? ?Last Vitals:  ?Vitals:  ? 07/18/21 0430 07/18/21 0746  ?BP:  130/77  ?Pulse: 73 71  ?Resp:  18  ?Temp:  36.6 ?C  ?SpO2: 99% 100%  ?  ?Last Pain:  ?Vitals:  ? 07/18/21 0746  ?TempSrc: Oral  ?PainSc:   ? ? ?  ?  ?  ?  ?  ?  ? ?[name] ? ? ? ? ?

## 2021-07-18 NOTE — Progress Notes (Signed)
Post Partum Day 1 ?Subjective: ?Doing well, no complaints.  Tolerating regular diet, pain with PO meds, voiding and ambulating without difficulty. ? ?No CP SOB Fever,Chills, N/V or leg pain; denies nipple or breast pain, no HA change of vision, RUQ/epigastric pain ? ?Objective: ?BP 130/77 (BP Location: Right Arm)   Pulse 71   Temp 97.8 ?F (36.6 ?C) (Oral)   Resp 18   Ht 5\' 9"  (1.753 m)   Wt 90.3 kg   LMP 09/29/2020 (Within Days)   SpO2 100%   Breastfeeding Unknown   BMI 29.39 kg/m?  ?  ?Physical Exam:  ?General: NAD ?Breasts: soft/nontender ?CV: RRR ?Pulm: nl effort, CTABL ?Abdomen: soft, NT, BS x 4 ?Incision: Pressure Dsg CDI, no drainage noted.  ?Lochia: small ?Uterine Fundus: fundus firm and 2 fb below umbilicus ?DVT Evaluation: no cords, ttp LEs  ? ?Recent Labs  ?  07/17/21 ?1058 07/18/21 ?0610  ?HGB 11.3* 10.6*  ?HCT 33.8* 31.7*  ?WBC 19.1* 9.7  ?PLT 260 215  ? ? ?Assessment/Plan: ?21 y.o. G1P1001 postpartum day # 1 ? ?- Continue routine PP care ?- Lactation consult prn, pt pumping and formula feeding.   ?- Discussed contraceptive options including implant, IUDs hormonal and non-hormonal, injection, pills/ring/patch, condoms, and NFP. Would like OCPs for contraception.  ?- Acute blood loss anemia - hemodynamically stable and asymptomatic; start po ferrous sulfate BID with stool softeners  ?- Immunization status: Nall Imms up to date ? ? ? ?Disposition: Does not desire Dc home today.  ? ? ? ?30, CNM ?07/18/2021  ?9:16 AM ? ? ? ? ?

## 2021-07-19 LAB — TYPE AND SCREEN
ABO/RH(D): O POS
Antibody Screen: POSITIVE
Extend sample reason: UNDETERMINED
Unit division: 0
Unit division: 0

## 2021-07-19 LAB — BPAM RBC
Blood Product Expiration Date: 202303282359
Blood Product Expiration Date: 202304022359
Unit Type and Rh: 5100
Unit Type and Rh: 5100

## 2021-07-19 MED ORDER — ACETAMINOPHEN 500 MG PO TABS: 1000.0000 mg | ORAL_TABLET | Freq: Four times a day (QID) | ORAL | 0 refills | Status: AC

## 2021-07-19 MED ORDER — FERROUS SULFATE 325 (65 FE) MG PO TABS: 325.0000 mg | ORAL_TABLET | Freq: Two times a day (BID) | ORAL | 0 refills | Status: DC

## 2021-07-19 MED ORDER — GABAPENTIN 300 MG PO CAPS: 300.0000 mg | ORAL_CAPSULE | Freq: Two times a day (BID) | ORAL | 0 refills | Status: DC

## 2021-07-19 MED ORDER — PRENATAL MULTIVITAMIN CH: 1.0000 | ORAL_TABLET | Freq: Every day | ORAL | Status: AC

## 2021-07-19 MED ORDER — SIMETHICONE 80 MG PO CHEW: 80.0000 mg | CHEWABLE_TABLET | Freq: Three times a day (TID) | ORAL | 0 refills | Status: DC

## 2021-07-19 MED ORDER — IBUPROFEN 600 MG PO TABS: 600.0000 mg | ORAL_TABLET | Freq: Four times a day (QID) | ORAL | 0 refills | Status: DC

## 2021-07-19 MED ORDER — OXYCODONE HCL 5 MG PO TABS: 5.0000 mg | ORAL_TABLET | ORAL | 0 refills | Status: DC | PRN

## 2021-07-19 NOTE — TOC CM/SW Note (Signed)
Spoke with patient and FOB/Boyfriend regarding support system. FOB has taken off work for 3 weeks to assist and patient reports strong family support system. Has history of anxiety however does not take medications due to history of addiction. Leans on her support system for additional support when needed. Understands PPD and resources if needed. Reports she is seeking PCP and planning to call [facility] to find out who her current assigned PCP is. Engaged with WIC and food stamps. Has selected [facility] for Pediatrician. Reports having all needed equipment and no current TOC needs or concerns.  ?

## 2021-07-19 NOTE — Discharge Instructions (Signed)

## 2021-07-19 NOTE — Progress Notes (Signed)
Patient discharged home with family.  Discharge instructions, when to follow up, and prescriptions reviewed with patient.  Patient verbalized understanding. Patient will be escorted out by auxiliary.   

## 2021-07-26 ENCOUNTER — Telehealth: Payer: Self-pay | Admitting: Lactation Services

## 2021-07-26 NOTE — Telephone Encounter (Signed)
Referral sent from baby's provider from [facility]. Mom was formula feeding in the hospital but had stated to provider that she was using a manual pump and pumping 5+ oz. Referral sent for support. ?[employer] student reached out. [name] was blunt and stated for this department to never contact her again and she didn't know who the H---- sent a referral".  ?Apologies given. Conversation ended. ?

## 2021-10-10 IMAGING — US US OB < 14 WEEKS - US OB TV
1 series · 14 of 28 positions shown · non-contrast
Comparison: None.

CLINICAL DATA: One day vaginal spotting, beta hCG of 88,811

EXAM:
OBSTETRIC <14 WK US AND TRANSVAGINAL OB US
TECHNIQUE: Both transabdominal and transvaginal ultrasound examinations were
performed for complete evaluation of the gestation as well as the
maternal uterus, adnexal regions, and pelvic cul-de-sac.
Transvaginal technique was performed to assess early pregnancy.

[Series 1: us ob less than 14 weeks with ob transvaginal · 14 of 120 slices shown]
[im 5/120]
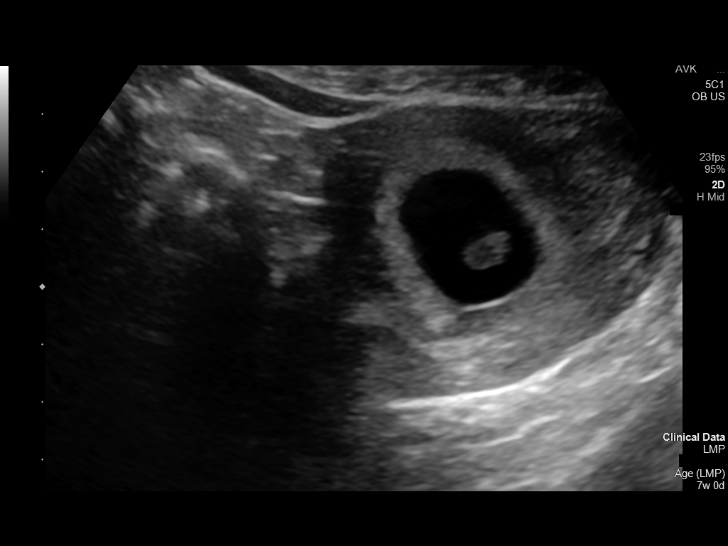
[im 14/120]
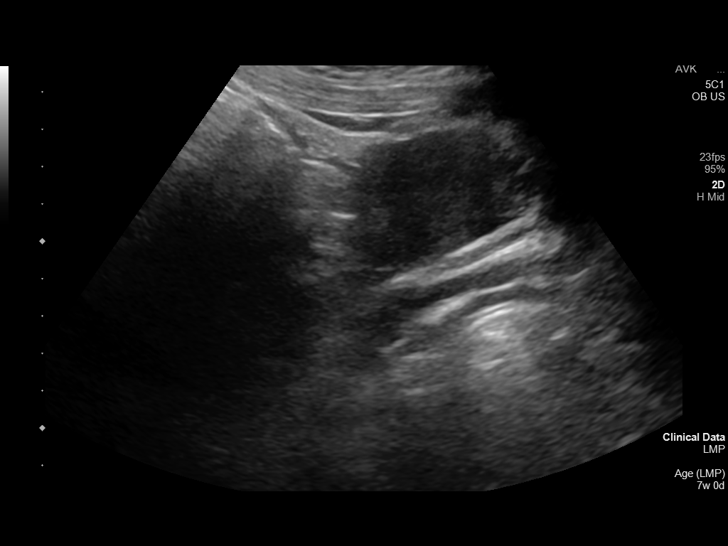
[im 23/120]
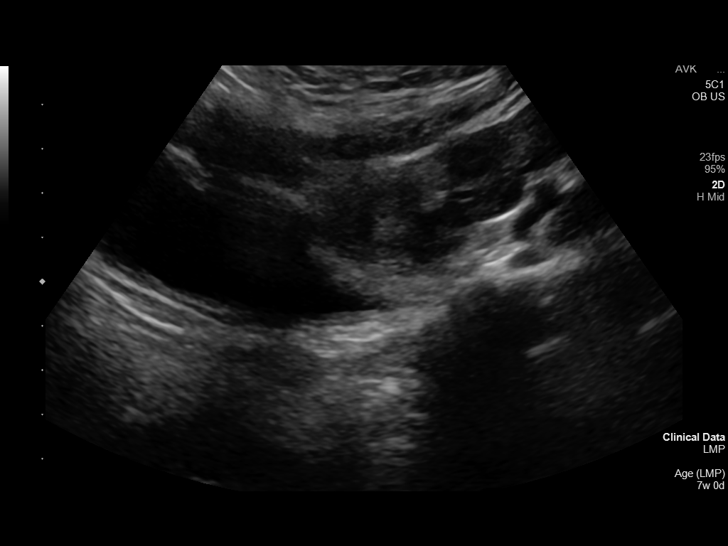
[im 31/120]
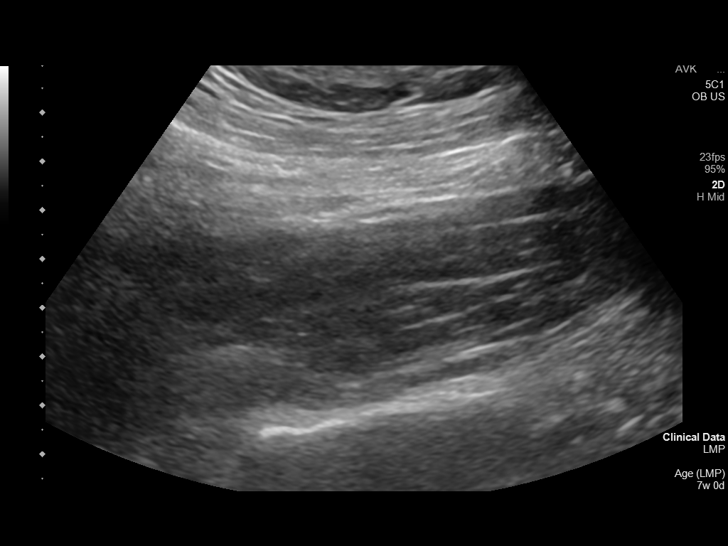
[im 40/120]
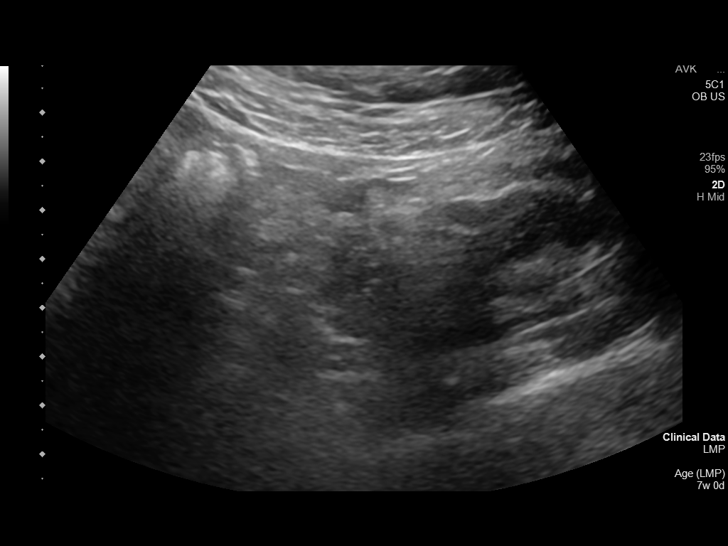
[im 49/120]
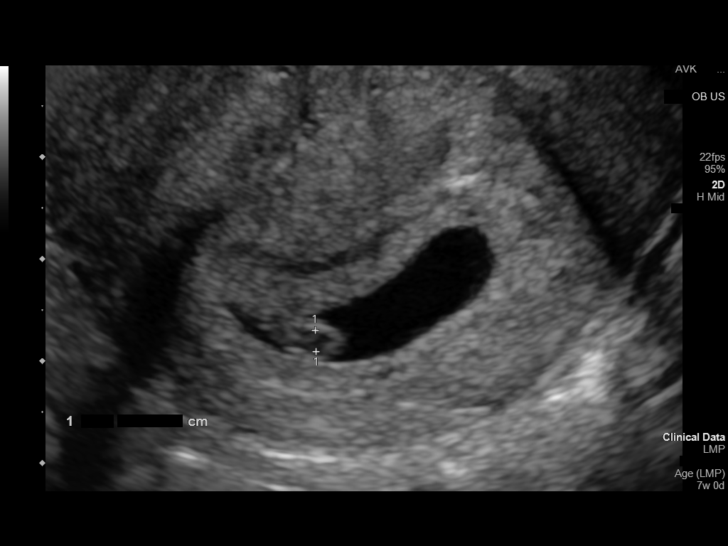
[im 58/120]
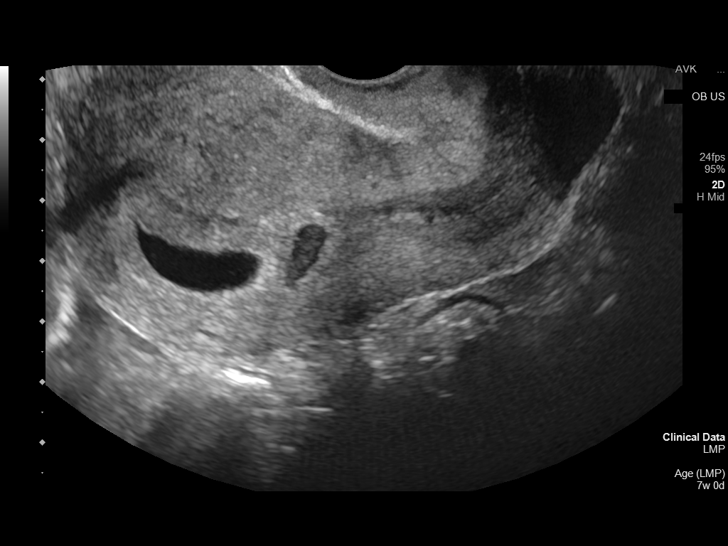
[im 67/120]
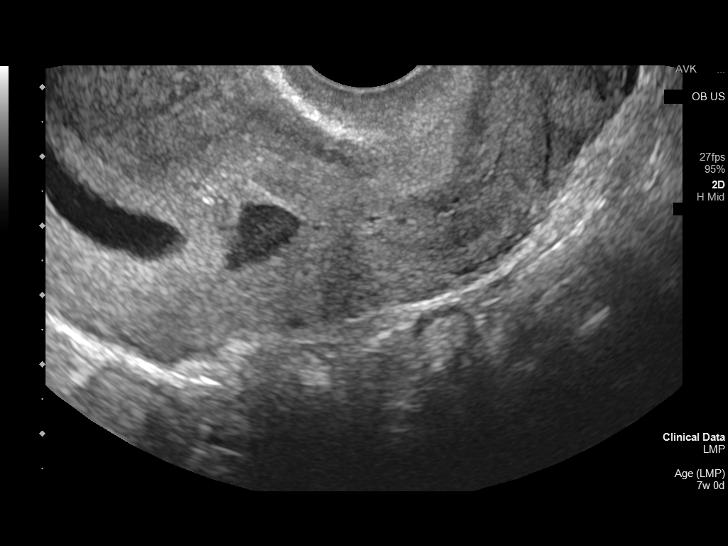
[im 75/120]
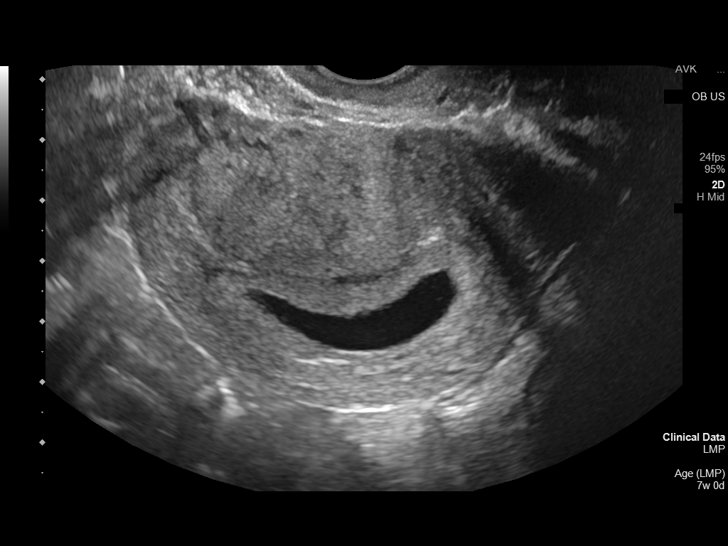
[im 84/120]
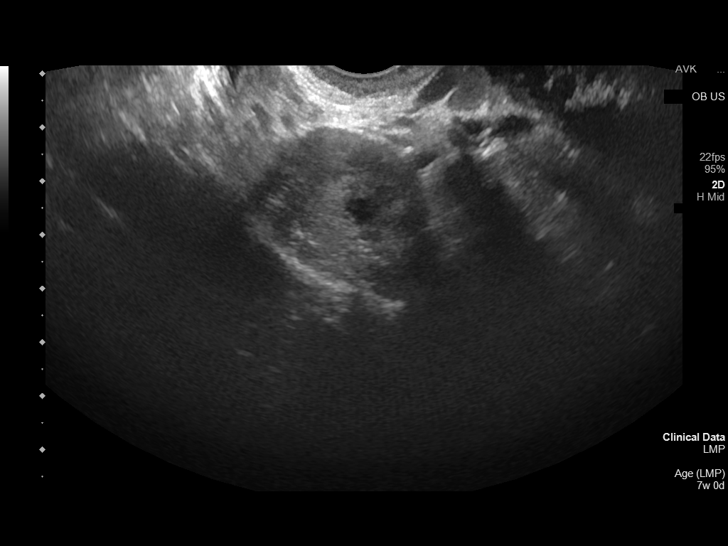
[im 93/120]
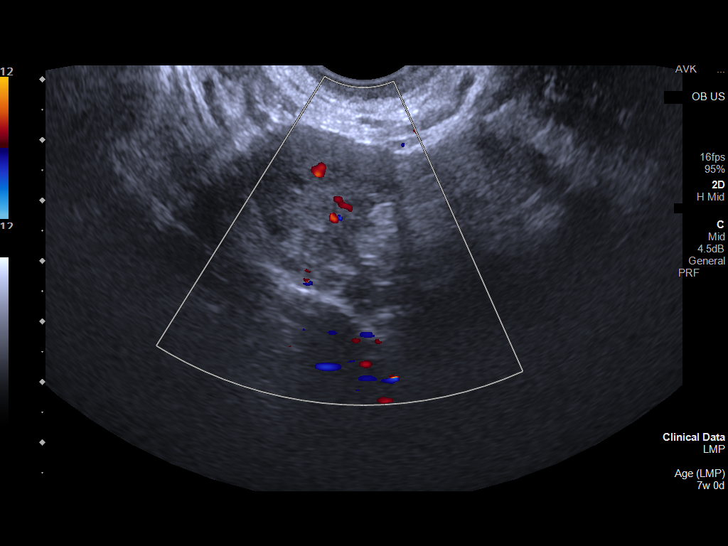
[im 102/120]
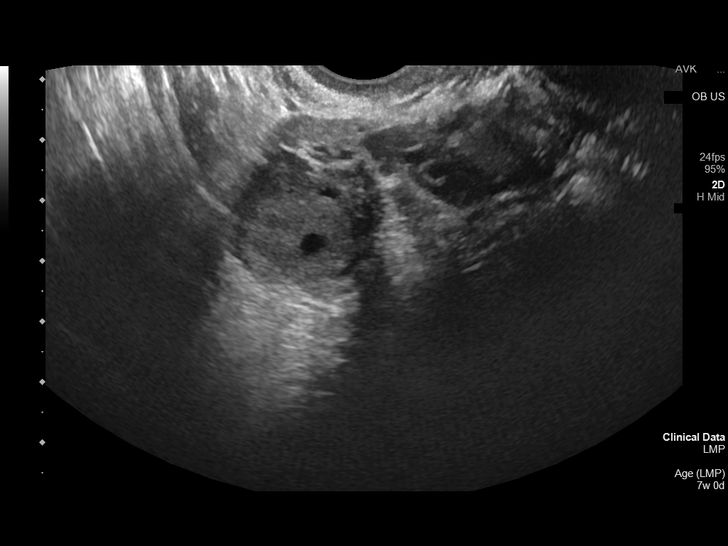
[im 111/120]
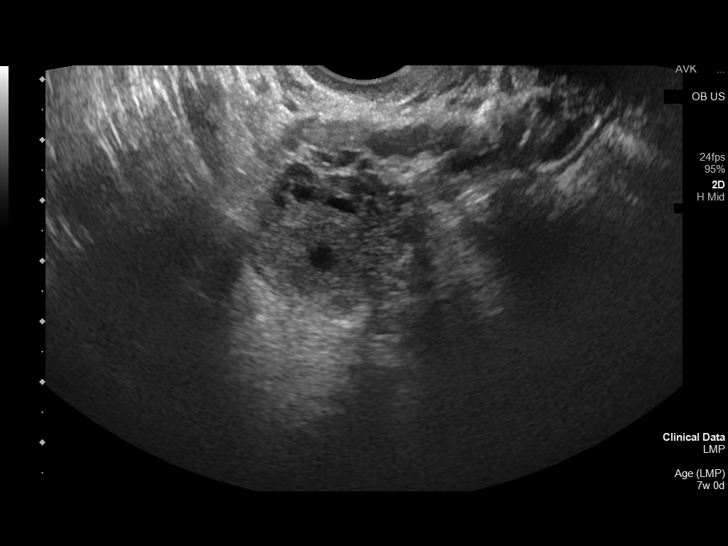
[im 120/120]
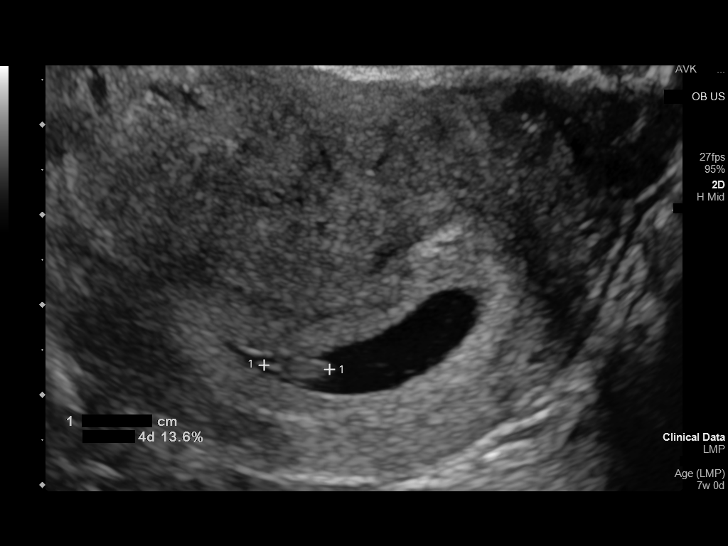

[14 of 28 positions shown; findings below may reference images not displayed]

FINDINGS: Intrauterine gestational sac: Single

Yolk sac:  Visualized.

Embryo:  Visualized.

Cardiac Activity: Visualized.

Heart Rate: 123 bpm

CRL: 7.3 mm   6 w   4 d                  US EDC: July 18, 2021

Subchorionic hemorrhage:  Small volume subchorionic hemorrhage.

Maternal uterus/adnexae: Corpus luteum in the left ovary. Right
ovary is unremarkable. No pelvic free fluid.
IMPRESSION: Single viable intrauterine pregnancy, described above with small
volume subchorionic hemorrhage.

## 2022-01-31 ENCOUNTER — Other Ambulatory Visit: Payer: Self-pay

## 2022-01-31 ENCOUNTER — Emergency Department
Admission: EM | Admit: 2022-01-31 | Discharge: 2022-01-31 | Disposition: A | Discharge status: Home / Self Care | Payer: Medicaid Other | Attending: Emergency Medicine | Admitting: Emergency Medicine

## 2022-01-31 DIAGNOSIS — R300 Dysuria: Secondary | ICD-10-CM | POA: Diagnosis present

## 2022-01-31 DIAGNOSIS — N390 Urinary tract infection, site not specified: Secondary | ICD-10-CM | POA: Diagnosis not present

## 2022-01-31 LAB — URINALYSIS, ROUTINE W REFLEX MICROSCOPIC
Bilirubin Urine: NEGATIVE
Glucose, UA: NEGATIVE mg/dL
Hgb urine dipstick: NEGATIVE
Ketones, ur: NEGATIVE mg/dL
Nitrite: NEGATIVE
Protein, ur: 30 mg/dL — AB
Specific Gravity, Urine: 1.017 (ref 1.005–1.030)
WBC, UA: >50 WBC/hpf — ABNORMAL HIGH (ref 0–5)
pH: 6 (ref 5.0–8.0)

## 2022-01-31 LAB — PREGNANCY, URINE: Preg Test, Ur: NEGATIVE

## 2022-01-31 MED ORDER — PHENAZOPYRIDINE HCL 100 MG PO TABS: 100.0000 mg | ORAL_TABLET | Freq: Three times a day (TID) | ORAL | 0 refills | Status: DC | PRN

## 2022-01-31 MED ORDER — CEPHALEXIN 500 MG PO CAPS: 1000.0000 mg | ORAL_CAPSULE | Freq: Two times a day (BID) | ORAL | 0 refills | Status: DC

## 2022-01-31 MED ORDER — CEPHALEXIN 500 MG PO CAPS
1000.0000 mg | ORAL_CAPSULE | Freq: Once | ORAL | Status: AC
  Administered 2022-01-31: 1000 mg via ORAL
  Filled 2022-01-31: qty 2

## 2022-01-31 NOTE — ED Provider Notes (Signed)
[facility] Provider Note  Patient Contact: 5:31 PM (approximate)   History   Urinary Frequency   HPI  [name] is a 21 y.o. female who presents the emergency department complaining of UTI symptoms.  Patient has had some burning with urination, pelvic pressure.  No hematuria.  No flank pain.  No fevers or chills.  No nausea vomiting or diarrhea.  Patient states that she has had UTIs in the past and this feels similar.  No medications prior to arrival.  No other complaints at this time.     Physical Exam   Triage Vital Signs: ED Triage Vitals  Enc Vitals Group     BP --      Pulse Rate 01/31/22 1554 99     Resp 01/31/22 1554 18     Temp 01/31/22 1554 98 F (36.7 C)     Temp src --      SpO2 01/31/22 1554 94 %     Weight 01/31/22 1554 180 lb (81.6 kg)     Height 01/31/22 1650 5\' 9"  (1.753 m)     Head Circumference --      Peak Flow --      Pain Score 01/31/22 1554 0     Pain Loc --      Pain Edu? --      Excl. in GC? --     Most recent vital signs: Vitals:   01/31/22 1554  Pulse: 99  Resp: 18  Temp: 98 F (36.7 C)  SpO2: 94%     General: Alert and in no acute distress.   Cardiovascular:  Good peripheral perfusion Respiratory: Normal respiratory effort without tachypnea or retractions. Lungs CTAB.  Gastrointestinal: Bowel sounds 4 quadrants. Soft and nontender to palpation. No guarding or rigidity. No palpable masses. No distention. No CVA tenderness. Musculoskeletal: Full range of motion to all extremities.  Neurologic:  No gross focal neurologic deficits are appreciated.  Skin:   No rash noted Other:   ED Results / Procedures / Treatments   Labs (all labs ordered are listed, but only abnormal results are displayed) Labs Reviewed  URINALYSIS, ROUTINE W REFLEX MICROSCOPIC - Abnormal; Notable for the following components:      Result Value   Color, Urine YELLOW (*)    APPearance CLOUDY (*)    Protein, ur 30 (*)     Leukocytes,Ua LARGE (*)    WBC, UA >50 (*)    Bacteria, UA MANY (*)    All other components within normal limits  PREGNANCY, URINE     EKG     RADIOLOGY    No results found.  PROCEDURES:  Critical Care performed: No  Procedures   MEDICATIONS ORDERED IN ED: Medications  cephALEXin (KEFLEX) capsule 1,000 mg (has no administration in time range)     IMPRESSION / MDM / ASSESSMENT AND PLAN / ED COURSE  I reviewed the triage vital signs and the nursing notes.                              Differential diagnosis includes, but is not limited to, UTI, pyelonephritis, cystitis, pregnancy  Patient's presentation is most consistent with acute presentation with potential threat to life or bodily function.   Patient's diagnosis is consistent with UTI.  Patient presented to the emergency department complaining of UTI-like symptoms.  No concerning complaints of fevers, abdominal pain, flank pain.  Exam was reassuring.  Patient has findings consistent with UTI and urinalysis.  I will prescribe Pyridium and Keflex for the patient..  Return precautions discussed with the patient.  Follow-up primary care as needed.  Patient is given ED precautions to return to the ED for any worsening or new symptoms.        FINAL CLINICAL IMPRESSION(S) / ED DIAGNOSES   Final diagnoses:  Lower urinary tract infectious disease     Rx / DC Orders   ED Discharge Orders          Ordered    cephALEXin (KEFLEX) 500 MG capsule  2 times daily        01/31/22 1736    phenazopyridine (PYRIDIUM) 100 MG tablet  3 times daily PRN        01/31/22 1736             Note:  This document was prepared using Dragon voice recognition software and may include unintentional dictation errors.   Lanette Hampshire 01/31/22 1736    Concha Se, MD 02/01/22 Norberta Keens

## 2022-01-31 NOTE — ED Notes (Signed)
See triage note  Presents with some urinary freq and dysuria  States sx's started couple of days ago  Denies any fever

## 2022-01-31 NOTE — ED Triage Notes (Signed)
Pt coming pov from home for poss uti, pt stating urinary frequency, burning when peeing and slight pressure in bladder.  PT has had uti in past with similar symptoms.

## 2022-06-29 ENCOUNTER — Emergency Department: Payer: Medicaid Other

## 2022-06-29 ENCOUNTER — Emergency Department
Admission: EM | Admit: 2022-06-29 | Discharge: 2022-06-29 | Disposition: A | Discharge status: Home / Self Care | Payer: Medicaid Other | Attending: Student in an Organized Health Care Education/Training Program | Admitting: Student in an Organized Health Care Education/Training Program

## 2022-06-29 DIAGNOSIS — O2341 Unspecified infection of urinary tract in pregnancy, first trimester: Secondary | ICD-10-CM | POA: Insufficient documentation

## 2022-06-29 DIAGNOSIS — Z3A01 Less than 8 weeks gestation of pregnancy: Secondary | ICD-10-CM | POA: Diagnosis not present

## 2022-06-29 DIAGNOSIS — U071 COVID-19: Secondary | ICD-10-CM | POA: Diagnosis not present

## 2022-06-29 DIAGNOSIS — R1030 Lower abdominal pain, unspecified: Secondary | ICD-10-CM

## 2022-06-29 DIAGNOSIS — O26891 Other specified pregnancy related conditions, first trimester: Secondary | ICD-10-CM | POA: Diagnosis present

## 2022-06-29 LAB — URINALYSIS, ROUTINE W REFLEX MICROSCOPIC
Bilirubin Urine: NEGATIVE
Glucose, UA: NEGATIVE mg/dL
Ketones, ur: 80 mg/dL — AB
Leukocytes,Ua: NEGATIVE
Nitrite: NEGATIVE
Protein, ur: NEGATIVE mg/dL
Specific Gravity, Urine: 1.018 (ref 1.005–1.030)
pH: 5 (ref 5.0–8.0)

## 2022-06-29 LAB — POC URINE PREG, ED
Preg Test, Ur: POSITIVE — AB
Preg Test, Ur: POSITIVE — AB

## 2022-06-29 LAB — RESP PANEL BY RT-PCR (RSV, FLU A&B, COVID)  RVPGX2
Influenza A by PCR: NEGATIVE
Influenza B by PCR: NEGATIVE
Resp Syncytial Virus by PCR: NEGATIVE
SARS Coronavirus 2 by RT PCR: POSITIVE — AB

## 2022-06-29 LAB — COMPREHENSIVE METABOLIC PANEL
ALT: 23 U/L (ref 0–44)
AST: 24 U/L (ref 15–41)
Albumin: 4.4 g/dL (ref 3.5–5.0)
Alkaline Phosphatase: 49 U/L (ref 38–126)
Anion gap: 10 (ref 5–15)
BUN: 6 mg/dL (ref 6–20)
CO2: 20 mmol/L — ABNORMAL LOW (ref 22–32)
Calcium: 9.5 mg/dL (ref 8.9–10.3)
Chloride: 103 mmol/L (ref 98–111)
Creatinine, Ser: 0.62 mg/dL (ref 0.44–1.00)
GFR, Estimated: >60 mL/min (ref >60)
Glucose, Bld: 98 mg/dL (ref 70–99)
Potassium: 3.7 mmol/L (ref 3.5–5.1)
Sodium: 133 mmol/L — ABNORMAL LOW (ref 135–145)
Total Bilirubin: 0.6 mg/dL (ref 0.3–1.2)
Total Protein: 7.5 g/dL (ref 6.5–8.1)

## 2022-06-29 LAB — CBC WITH DIFFERENTIAL/PLATELET
Abs Immature Granulocytes: 0.02 10*3/uL (ref 0.00–0.07)
Basophils Absolute: 0 10*3/uL (ref 0.0–0.1)
Basophils Relative: 0 %
Eosinophils Absolute: 0 10*3/uL (ref 0.0–0.5)
Eosinophils Relative: 0 %
HCT: 35.6 % — ABNORMAL LOW (ref 36.0–46.0)
Hemoglobin: 12.6 g/dL (ref 12.0–15.0)
Immature Granulocytes: 0 %
Lymphocytes Relative: 7 %
Lymphs Abs: 0.4 10*3/uL — ABNORMAL LOW (ref 0.7–4.0)
MCH: 29.4 pg (ref 26.0–34.0)
MCHC: 35.4 g/dL (ref 30.0–36.0)
MCV: 83.2 fL (ref 80.0–100.0)
Monocytes Absolute: 0.4 10*3/uL (ref 0.1–1.0)
Monocytes Relative: 9 %
Neutro Abs: 4 10*3/uL (ref 1.7–7.7)
Neutrophils Relative %: 84 %
Platelets: 240 10*3/uL (ref 150–400)
RBC: 4.28 MIL/uL (ref 3.87–5.11)
RDW: 11.5 % (ref 11.5–15.5)
WBC: 4.8 10*3/uL (ref 4.0–10.5)
nRBC: 0 % (ref 0.0–0.2)

## 2022-06-29 LAB — ABO/RH: ABO/RH(D): O POS

## 2022-06-29 LAB — LIPASE, BLOOD: Lipase: 30 U/L (ref 11–51)

## 2022-06-29 LAB — HCG, QUANTITATIVE, PREGNANCY: hCG, Beta Chain, Quant, S: 13023 m[IU]/mL — ABNORMAL HIGH (ref <5)

## 2022-06-29 MED ORDER — SODIUM CHLORIDE 0.9 % IV SOLN
1.0000 g | Freq: Once | INTRAVENOUS | Status: AC
  Administered 2022-06-29: 1 g via INTRAVENOUS
  Filled 2022-06-29: qty 10

## 2022-06-29 MED ORDER — ACETAMINOPHEN 325 MG PO TABS
650.0000 mg | ORAL_TABLET | Freq: Once | ORAL | Status: AC
  Administered 2022-06-29: 650 mg via ORAL
  Filled 2022-06-29: qty 2

## 2022-06-29 MED ORDER — CEFDINIR 300 MG PO CAPS: 300.0000 mg | ORAL_CAPSULE | Freq: Two times a day (BID) | ORAL | 0 refills | Status: AC

## 2022-06-29 MED ORDER — CEFDINIR 300 MG PO CAPS: 300.0000 mg | ORAL_CAPSULE | Freq: Two times a day (BID) | ORAL | 0 refills | Status: DC

## 2022-06-29 NOTE — ED Triage Notes (Signed)
Pt presents to the ED via POV due to back pain. Pt states its all over but mainly in her back. Pt states she recently found out she was pregnant. Pt did recently start working out again 3 days ago. Pt a&ox4

## 2022-06-29 NOTE — ED Provider Notes (Signed)
[facility] Provider Note  Patient Contact: 5:24 PM (approximate)   History   Back Pain   HPI  [name] is a 22 y.o. female presents to the emergency department with multiple medical complaints.  Patient is primarily concerned as she has had 10 out of 10 back pain.  Patient reports that she is prone to urinary tract infections.  She denies dysuria, increased urinary frequency, vomiting or abdominal pain.  She reports that she is approximately [redacted] weeks pregnant and has had no vaginal bleeding.  No chest pain, chest tightness or shortness of breath.      Physical Exam   Triage Vital Signs: ED Triage Vitals  Enc Vitals Group     BP 06/29/22 1554 (!) 140/69     Pulse Rate 06/29/22 1554 (!) 116     Resp 06/29/22 1554 17     Temp 06/29/22 1554 99.4 F (37.4 C)     Temp Source 06/29/22 1554 Oral     SpO2 06/29/22 1554 97 %     Weight 06/29/22 1555 170 lb (77.1 kg)     Height 06/29/22 1555 5\' 9"  (1.753 m)     Head Circumference --      Peak Flow --      Pain Score --      Pain Loc --      Pain Edu? --      Excl. in GC? --     Most recent vital signs: Vitals:   06/29/22 1945 06/29/22 2110  BP:  136/79  Pulse: (!) 112 (!) 111  Resp:  16  Temp:    SpO2: 97% 97%     General: Alert and in no acute distress. Eyes:  PERRL. EOMI. Head: No acute traumatic findings ENT:      Nose: No congestion/rhinnorhea.      Mouth/Throat: Mucous membranes are moist. Neck: No stridor. No cervical spine tenderness to palpation. Cardiovascular:  Good peripheral perfusion Respiratory: Normal respiratory effort without tachypnea or retractions. Lungs CTAB. Good air entry to the bases with no decreased or absent breath sounds. Gastrointestinal: Bowel sounds 4 quadrants. Soft and nontender to palpation. No guarding or rigidity. No palpable masses. No distention. No CVA tenderness. Musculoskeletal: Full range of motion to all extremities.  Neurologic:  No gross  focal neurologic deficits are appreciated.  Skin:   No rash noted    ED Results / Procedures / Treatments   Labs (all labs ordered are listed, but only abnormal results are displayed) Labs Reviewed  RESP PANEL BY RT-PCR (RSV, FLU A&B, COVID)  RVPGX2 - Abnormal; Notable for the following components:      Result Value   SARS Coronavirus 2 by RT PCR POSITIVE (*)    All other components within normal limits  URINALYSIS, ROUTINE W REFLEX MICROSCOPIC - Abnormal; Notable for the following components:   Color, Urine YELLOW (*)    APPearance CLOUDY (*)    Hgb urine dipstick MODERATE (*)    Ketones, ur 80 (*)    Bacteria, UA MANY (*)    All other components within normal limits  CBC WITH DIFFERENTIAL/PLATELET - Abnormal; Notable for the following components:   HCT 35.6 (*)    Lymphs Abs 0.4 (*)    All other components within normal limits  COMPREHENSIVE METABOLIC PANEL - Abnormal; Notable for the following components:   Sodium 133 (*)    CO2 20 (*)    All other components within normal limits  HCG,  QUANTITATIVE, PREGNANCY - Abnormal; Notable for the following components:   hCG, Beta Chain, Quant, S 13,023 (*)    All other components within normal limits  POC URINE PREG, ED - Abnormal; Notable for the following components:   Preg Test, Ur Positive (*)    All other components within normal limits  POC URINE PREG, ED - Abnormal; Notable for the following components:   Preg Test, Ur POSITIVE (*)    All other components within normal limits  LIPASE, BLOOD  ABO/RH       RADIOLOGY  I personally viewed and evaluated these images as part of my medical decision making, as well as reviewing the written report by the radiologist.  ED Provider Interpretation: Probable intrauterine gestational sac without yolk sac or fetal pole.  No cardiac activity visualized.   PROCEDURES:  Critical Care performed: No  Procedures   MEDICATIONS ORDERED IN ED: Medications  cefTRIAXone (ROCEPHIN)  1 g in sodium chloride 0.9 % 100 mL IVPB (0 g Intravenous Stopped 06/29/22 2135)     IMPRESSION / MDM / ASSESSMENT AND PLAN / ED COURSE  I reviewed the triage vital signs and the nursing notes.                              Assessment and plan: Low back pain:   22 year old female presents to the emergency department with low back pain.  Patient was tachycardic at triage but vital signs otherwise reassuring.  On exam, patient was alert and nontoxic-appearing.  CBC indicates normal white blood cell count.  Patient had mild hyponatremia on CMP.  Lipase within range.  [place]-hCG [place] at [address].  Patient tested positive for COVID-19.  Dedicated OB ultrasound indicates probable intrauterine pregnancy with no yolk sac or fetal pole without cardiac activity.  Radiologist recommends repeat ultrasound in the next 10 to 14 days.  Given low back pain and findings suggestive of UTI, I did treat patient empirically for pyelonephritis with Rocephin in the emergency department and Omnicef at discharge.  Patient is O+.  Return precautions were given to return with new or worsening symptoms.  All patient questions were answered.   FINAL CLINICAL IMPRESSION(S) / ED DIAGNOSES   Final diagnoses:  Urinary tract infection in mother during first trimester of pregnancy  COVID-19     Rx / DC Orders   ED Discharge Orders          Ordered    cefdinir (OMNICEF) 300 MG capsule  2 times daily,   Status:  Discontinued        06/29/22 2146    cefdinir (OMNICEF) 300 MG capsule  2 times daily        06/29/22 2200             Note:  This document was prepared using Dragon voice recognition software and may include unintentional dictation errors.   Pia Mau [place], Cordelia Poche 06/29/22 2203    Willy Eddy, MD 06/30/22 [phone]

## 2022-06-29 NOTE — Discharge Instructions (Signed)
Take Omnicef twice daily for seven days.

## 2022-06-29 NOTE — ED Notes (Signed)
Patient is back from ultrasound. Significant other at bedside.

## 2022-07-08 ENCOUNTER — Ambulatory Visit: Payer: Medicaid Other

## 2022-08-02 LAB — OB RESULTS CONSOLE VARICELLA ZOSTER ANTIBODY, IGG: Varicella: IMMUNE

## 2022-08-02 LAB — OB RESULTS CONSOLE HEPATITIS B SURFACE ANTIGEN: Hepatitis B Surface Ag: NEGATIVE

## 2022-08-02 LAB — OB RESULTS CONSOLE RUBELLA ANTIBODY, IGM: Rubella: IMMUNE

## 2022-08-07 DIAGNOSIS — Z3480 Encounter for supervision of other normal pregnancy, unspecified trimester: Secondary | ICD-10-CM

## 2022-10-08 ENCOUNTER — Other Ambulatory Visit: Payer: Self-pay | Admitting: Certified Nurse Midwife

## 2022-10-08 DIAGNOSIS — N6002 Solitary cyst of left breast: Secondary | ICD-10-CM

## 2022-10-15 ENCOUNTER — Other Ambulatory Visit: Payer: Self-pay | Admitting: Certified Nurse Midwife

## 2022-10-15 ENCOUNTER — Ambulatory Visit
Admission: RE | Admit: 2022-10-15 | Discharge: 2022-10-15 | Disposition: A | Discharge status: Home / Self Care | Payer: Medicaid Other | Source: Ambulatory Visit | Attending: Certified Nurse Midwife | Admitting: Certified Nurse Midwife

## 2022-10-15 DIAGNOSIS — N6002 Solitary cyst of left breast: Secondary | ICD-10-CM | POA: Insufficient documentation

## 2023-01-02 ENCOUNTER — Observation Stay
Admission: EM | Admit: 2023-01-02 | Discharge: 2023-01-02 | Disposition: A | Discharge status: Home / Self Care | Payer: Medicaid Other | Source: Home / Self Care | Admitting: Obstetrics and Gynecology

## 2023-01-02 ENCOUNTER — Other Ambulatory Visit: Payer: Self-pay

## 2023-01-02 ENCOUNTER — Encounter: Payer: Self-pay | Admitting: Obstetrics and Gynecology

## 2023-01-02 DIAGNOSIS — O368190 Decreased fetal movements, unspecified trimester, not applicable or unspecified: Principal | ICD-10-CM | POA: Diagnosis present

## 2023-01-02 DIAGNOSIS — Z3A32 32 weeks gestation of pregnancy: Secondary | ICD-10-CM | POA: Insufficient documentation

## 2023-01-02 DIAGNOSIS — O368130 Decreased fetal movements, third trimester, not applicable or unspecified: Principal | ICD-10-CM | POA: Insufficient documentation

## 2023-01-02 NOTE — Discharge Summary (Signed)
Patient ID: JASHAY HOUSLEY MRN: [id number] DOB/AGE: 2000/05/31 22 y.o.  Admit date: 01/02/2023 Discharge date: 01/02/2023  Admission Diagnoses: 22yo G2P1 at [redacted]w[redacted]d presents for DFM, denies UC and VB.  Discharge Diagnoses: RNST  Factors complicating this pregnancy  First child died from SIDS H/o mental health diagnoses: Anxiety and Depression Previous C/S x1 History of substance use Took baby aspirin in previous pregnancy, will continue it in this pregnancy Enlarged left axillae/breast cyst?   Prenatal Procedures: NST  Consults: None  Significant Diagnostic Studies:  No results found for this or any previous visit (from the past 168 hour(s)).  Treatments: none  Hospital Course:  This is a 22 y.o. G2P1001 with IUP at [redacted]w[redacted]d seen for DFM.  No leaking of fluid and no bleeding.  She had a reactive NST.  She was observed, fetal heart rate monitoring remained reassuring, and she had no signs/symptoms of preterm labor or other maternal-fetal concerns.  She was deemed stable for discharge to home with outpatient follow up.  Discharge Physical Exam:  BP 122/67   Pulse (!) 104   Temp 98 F (36.7 C) (Oral)   Resp 16   LMP 05/19/2022   General: NAD CV: RRR Pulm: nl effort ABD: s/nd/nt, gravid DVT Evaluation: LE non-ttp, no evidence of DVT on exam.  NST: FHR baseline: 150 bpm Variability: moderate Accelerations: yes Decelerations: none Category/reactivity: reactive  TOCO: quiet SVE: deferred      Discharge Condition: Stable  Disposition: Discharge disposition: 01-Home or Self Care        Allergies as of 01/02/2023   No Known Allergies      Medication List     STOP taking these medications    cephALEXin 500 MG capsule Commonly known as: KEFLEX   gabapentin 300 MG capsule Commonly known as: NEURONTIN   ibuprofen 600 MG tablet Commonly known as: ADVIL   phenazopyridine 100 MG tablet Commonly known as: Pyridium   simethicone 80 MG chewable  tablet Commonly known as: MYLICON       TAKE these medications    acetaminophen 500 MG tablet Commonly known as: TYLENOL Take 2 tablets (1,000 mg total) by mouth every 6 (six) hours.   aspirin 81 MG chewable tablet Chew 81 mg by mouth daily.   calcium carbonate 500 MG chewable tablet Commonly known as: TUMS - dosed in mg elemental calcium Chew 1 tablet by mouth daily as needed for indigestion or heartburn.   ferrous sulfate 325 (65 FE) MG tablet Take 1 tablet (325 mg total) by mouth 2 (two) times daily with a meal. For anemia, take with Vitamin C   prenatal multivitamin Tabs tablet Take 1 tablet by mouth daily at 12 noon.        Follow-up Information     [facility] Follow up.   Why: Keep all scheduled appointments Contact information: [address] [place] [address] [phone]                Signed:  Quillian Quince 01/02/2023 6:48 PM

## 2023-01-02 NOTE — OB Triage Note (Signed)
23 y.o G2P1001 presents to L&D triage at [redacted]w[redacted]d with c/o reduced fetal movement that she noticed today. She notes that she's felt some movement but says that it's been less today than normal. She denies ctx's, vaginal bleeding, or lof. Monitors applied and assessing, initial fht 150 and pt has reported feeling the baby move while in triage. Pt had an elevated bp of 140/75, bp's cycling q15. [place], CNM aware of pt arrival.

## 2023-01-02 NOTE — OB Triage Note (Signed)
Pt being discharged by [place], CNM. NST was reactive, 1 ctx seen. Discharge instructions reviewed and pt informed to return for ctx's, vaginal bleeding, lof, or reduced fetal movement. Kick counts reviewed. Pt stable and ambulatory, discharged to home.

## 2023-01-08 ENCOUNTER — Encounter: Payer: Medicaid Other | Attending: Certified Nurse Midwife | Admitting: Dietician

## 2023-01-08 ENCOUNTER — Encounter: Payer: Self-pay | Admitting: Dietician

## 2023-01-08 DIAGNOSIS — Z713 Dietary counseling and surveillance: Secondary | ICD-10-CM | POA: Insufficient documentation

## 2023-01-08 DIAGNOSIS — O24410 Gestational diabetes mellitus in pregnancy, diet controlled: Secondary | ICD-10-CM | POA: Insufficient documentation

## 2023-01-08 DIAGNOSIS — O24419 Gestational diabetes mellitus in pregnancy, unspecified control: Secondary | ICD-10-CM

## 2023-01-08 DIAGNOSIS — Z3A00 Weeks of gestation of pregnancy not specified: Secondary | ICD-10-CM | POA: Diagnosis not present

## 2023-01-08 NOTE — Progress Notes (Signed)
Patient was seen on 01/08/2023 for Gestational Diabetes self-management class at the [facility]. The following learning objectives were met by the patient during this course:  States the definition of Gestational Diabetes States why dietary management is important in controlling blood glucose Describes the effects each nutrient has on blood glucose levels Demonstrates ability to create a balanced meal plan Demonstrates carbohydrate counting  States when to check blood glucose levels Demonstrates proper blood glucose monitoring techniques States the effect of stress and exercise on blood glucose levels States the importance of limiting caffeine and abstaining from alcohol and smoking  Patient instructed to monitor glucose levels: FBS: 60 - <90 1 hour: <140 2 hour: <120  *Patient received handouts: Nutrition Diabetes and Pregnancy Carbohydrate Counting List  Patient will be seen for follow-up as needed.

## 2023-01-18 ENCOUNTER — Other Ambulatory Visit: Payer: Self-pay

## 2023-01-18 ENCOUNTER — Observation Stay
Admission: EM | Admit: 2023-01-18 | Discharge: 2023-01-19 | Disposition: A | Discharge status: Home / Self Care | Payer: Medicaid Other | Attending: Certified Nurse Midwife | Admitting: Certified Nurse Midwife

## 2023-01-18 ENCOUNTER — Encounter: Payer: Self-pay | Admitting: Obstetrics and Gynecology

## 2023-01-18 DIAGNOSIS — Z3A35 35 weeks gestation of pregnancy: Secondary | ICD-10-CM | POA: Insufficient documentation

## 2023-01-18 DIAGNOSIS — Z79899 Other long term (current) drug therapy: Secondary | ICD-10-CM | POA: Insufficient documentation

## 2023-01-18 DIAGNOSIS — Z98891 History of uterine scar from previous surgery: Secondary | ICD-10-CM | POA: Insufficient documentation

## 2023-01-18 DIAGNOSIS — O163 Unspecified maternal hypertension, third trimester: Principal | ICD-10-CM | POA: Insufficient documentation

## 2023-01-18 DIAGNOSIS — Z87891 Personal history of nicotine dependence: Secondary | ICD-10-CM | POA: Insufficient documentation

## 2023-01-18 DIAGNOSIS — Z3490 Encounter for supervision of normal pregnancy, unspecified, unspecified trimester: Principal | ICD-10-CM

## 2023-01-18 DIAGNOSIS — Z7982 Long term (current) use of aspirin: Secondary | ICD-10-CM | POA: Insufficient documentation

## 2023-01-18 NOTE — OB Triage Note (Signed)
[name] 22 y.o. G2P1 [redacted]w[redacted]d presents to Labor & Delivery triage via wheelchair steered by ED staff reporting swelling for the last 4 days and desiring a blood pressure evaluation. Negative for clonus, +2 reflexes, denies epigastric pain, headache, and vision changes. She denies signs and symptoms consistent with rupture of membranes or active vaginal bleeding. She denies contractions and states positive fetal movement. History of c/s and plans repeat. External FM and TOCO applied to non-tender abdomen. Initial FHR 155. Vital signs obtained and within normal limits. BP cycling q 15. Ice water provided. FOB Sharlet Salina at beside for support. Patient oriented to care environment including call bell and bed control use. Donato Schultz, CNM notified of patient's arrival. Plan to monitor BP, collect CBC, CMP, and P/C ratio.

## 2023-01-19 DIAGNOSIS — Z3490 Encounter for supervision of normal pregnancy, unspecified, unspecified trimester: Principal | ICD-10-CM

## 2023-01-19 DIAGNOSIS — Z3A35 35 weeks gestation of pregnancy: Secondary | ICD-10-CM | POA: Diagnosis not present

## 2023-01-19 DIAGNOSIS — Z87891 Personal history of nicotine dependence: Secondary | ICD-10-CM | POA: Diagnosis not present

## 2023-01-19 DIAGNOSIS — Z79899 Other long term (current) drug therapy: Secondary | ICD-10-CM | POA: Diagnosis not present

## 2023-01-19 DIAGNOSIS — Z7982 Long term (current) use of aspirin: Secondary | ICD-10-CM | POA: Diagnosis not present

## 2023-01-19 DIAGNOSIS — O163 Unspecified maternal hypertension, third trimester: Secondary | ICD-10-CM | POA: Diagnosis present

## 2023-01-19 DIAGNOSIS — Z98891 History of uterine scar from previous surgery: Secondary | ICD-10-CM | POA: Diagnosis not present

## 2023-01-19 LAB — PROTEIN / CREATININE RATIO, URINE
Creatinine, Urine: 232 mg/dL
Protein Creatinine Ratio: 0.16 mg/mg{creat} — ABNORMAL HIGH (ref 0.00–0.15)
Total Protein, Urine: 36 mg/dL

## 2023-01-19 LAB — CBC WITH DIFFERENTIAL/PLATELET
Abs Immature Granulocytes: 0.04 10*3/uL (ref 0.00–0.07)
Basophils Absolute: 0 10*3/uL (ref 0.0–0.1)
Basophils Relative: 0 %
Eosinophils Absolute: 0.1 10*3/uL (ref 0.0–0.5)
Eosinophils Relative: 1 %
HCT: 30.7 % — ABNORMAL LOW (ref 36.0–46.0)
Hemoglobin: 10.1 g/dL — ABNORMAL LOW (ref 12.0–15.0)
Immature Granulocytes: 1 %
Lymphocytes Relative: 17 %
Lymphs Abs: 1.4 10*3/uL (ref 0.7–4.0)
MCH: 26.9 pg (ref 26.0–34.0)
MCHC: 32.9 g/dL (ref 30.0–36.0)
MCV: 81.6 fL (ref 80.0–100.0)
Monocytes Absolute: 0.6 10*3/uL (ref 0.1–1.0)
Monocytes Relative: 7 %
Neutro Abs: 6 10*3/uL (ref 1.7–7.7)
Neutrophils Relative %: 74 %
Platelets: 251 10*3/uL (ref 150–400)
RBC: 3.76 MIL/uL — ABNORMAL LOW (ref 3.87–5.11)
RDW: 13.2 % (ref 11.5–15.5)
WBC: 8.1 10*3/uL (ref 4.0–10.5)
nRBC: 0 % (ref 0.0–0.2)

## 2023-01-19 LAB — COMPREHENSIVE METABOLIC PANEL
ALT: 12 U/L (ref 0–44)
AST: 15 U/L (ref 15–41)
Albumin: 2.8 g/dL — ABNORMAL LOW (ref 3.5–5.0)
Alkaline Phosphatase: 116 U/L (ref 38–126)
Anion gap: 8 (ref 5–15)
BUN: 9 mg/dL (ref 6–20)
CO2: 22 mmol/L (ref 22–32)
Calcium: 8.9 mg/dL (ref 8.9–10.3)
Chloride: 107 mmol/L (ref 98–111)
Creatinine, Ser: 0.72 mg/dL (ref 0.44–1.00)
GFR, Estimated: >60 mL/min (ref >60)
Glucose, Bld: 105 mg/dL — ABNORMAL HIGH (ref 70–99)
Potassium: 3.8 mmol/L (ref 3.5–5.1)
Sodium: 137 mmol/L (ref 135–145)
Total Bilirubin: 0.7 mg/dL (ref 0.3–1.2)
Total Protein: 6.1 g/dL — ABNORMAL LOW (ref 6.5–8.1)

## 2023-01-19 NOTE — Discharge Summary (Signed)
[name] is a 22 y.o. female. She is at [redacted]w[redacted]d gestation. Patient's last menstrual period was 05/19/2022. Estimated Date of Delivery: 02/23/23  Prenatal care site: [facility]  Current pregnancy complicated by:  - previous cesarean section - first child died from SIDS - Hx of substance abuse in 2022, no current usage - Enlarged left breast axillae - Uterine size-dates discrepancy  Chief complaint: elevated BP  She came in because she was concerned about elevated BP at home. She denies headaches, changes of vision, or RUQ pain. Reports good fetal movement.  S: Resting comfortably. no CTX, no VB.no LOF,  Active fetal movement.  Denies: HA, visual changes, SOB, or RUQ/epigastric pain  Maternal Medical History:   Past Medical History:  Diagnosis Date   Anxiety    History of chlamydia    treated   History of trichomoniasis    treated   History of urinary tract infection    Irregular heartbeat    Opioid abuse (HCC) 11/14/2019   Vasovagal syncope     Past Surgical History:  Procedure Laterality Date   CESAREAN SECTION N/A 07/17/2021   Procedure: CESAREAN SECTION;  Surgeon: Feliberto Gottron, Ihor Austin, MD;  Location: [facility] ORS;  Service: Obstetrics;  Laterality: N/A;    No Known Allergies  Prior to Admission medications   Medication Sig Start Date End Date Taking? Authorizing Provider  acetaminophen (TYLENOL) 500 MG tablet Take 2 tablets (1,000 mg total) by mouth every 6 (six) hours. 07/19/21  Yes Lache Dagher Renee, CNM  ascorbic acid (VITAMIN C) 500 MG tablet Take 500 mg by mouth daily.   Yes [provider]  aspirin 81 MG chewable tablet Chew 81 mg by mouth daily.   Yes [provider]  calcium carbonate (TUMS - DOSED IN MG ELEMENTAL CALCIUM) 500 MG chewable tablet Chew 1 tablet by mouth daily as needed for indigestion or heartburn.   Yes [provider]  Prenatal Vit-Fe Fumarate-FA (PRENATAL MULTIVITAMIN) TABS tablet Take 1 tablet by  mouth daily at 12 noon. 07/19/21  Yes Janyce Llanos, CNM  ferrous sulfate 325 (65 FE) MG tablet Take 1 tablet (325 mg total) by mouth 2 (two) times daily with a meal. For anemia, take with Vitamin C 07/19/21 09/17/21  Janyce Llanos, CNM     Social History: She  reports that she has quit smoking. Her smoking use included cigars and cigarettes. She has a 6 pack-year smoking history. She has never used smokeless tobacco. She reports current alcohol use. She reports current drug use. Drugs: Marijuana, Heroin, and Fentanyl.  Family History: family history includes COPD in her maternal grandmother; Cancer in her maternal grandfather; Depression in her maternal grandmother; Heart disease in her paternal grandfather; Liver cancer in her maternal grandfather; Lung cancer in her maternal grandfather; Multiple sclerosis in her paternal grandmother; Schizophrenia in her maternal grandmother.  no history of gyn cancers  Review of Systems: A full review of systems was performed and negative except as noted in the HPI.     O:  BP 126/67   Pulse 100   Temp 98.8 F (37.1 C) (Oral)   Resp 16   Ht 5\' 9"  (1.753 m)   Wt 94.3 kg   LMP 05/19/2022   BMI 30.72 kg/m  Vitals:   01/18/23 2352 01/19/23 0009 01/19/23 0023 01/19/23 0024  BP: 132/89 128/74 126/76 126/76   01/19/23 0039 01/19/23 0054 01/19/23 0109 01/19/23 0124  BP: 123/72 116/66 131/79 126/67    Results for  orders placed or performed during the hospital encounter of 01/18/23 (from the past 48 hour(s))  Protein / creatinine ratio, urine   Collection Time: 01/19/23 12:08 AM  Result Value Ref Range   Creatinine, Urine 232 mg/dL   Total Protein, Urine 36 mg/dL   Protein Creatinine Ratio 0.16 (H) 0.00 - 0.15 mg/mg[Cre]  Comprehensive metabolic panel   Collection Time: 01/19/23 12:20 AM  Result Value Ref Range   Sodium 137 135 - 145 mmol/L   Potassium 3.8 3.5 - 5.1 mmol/L   Chloride 107 98 - 111 mmol/L   CO2 22 22 - 32 mmol/L    Glucose, Bld 105 (H) 70 - 99 mg/dL   BUN 9 6 - 20 mg/dL   Creatinine, Ser 1.61 0.44 - 1.00 mg/dL   Calcium 8.9 8.9 - 09.6 mg/dL   Total Protein 6.1 (L) 6.5 - 8.1 g/dL   Albumin 2.8 (L) 3.5 - 5.0 g/dL   AST 15 15 - 41 U/L   ALT 12 0 - 44 U/L   Alkaline Phosphatase 116 38 - 126 U/L   Total Bilirubin 0.7 0.3 - 1.2 mg/dL   GFR, Estimated >04 >54 mL/min   Anion gap 8 5 - 15  CBC with Differential/Platelet   Collection Time: 01/19/23 12:20 AM  Result Value Ref Range   WBC 8.1 4.0 - 10.5 K/uL   RBC 3.76 (L) 3.87 - 5.11 MIL/uL   Hemoglobin 10.1 (L) 12.0 - 15.0 g/dL   HCT 09.8 (L) 11.9 - 14.7 %   MCV 81.6 80.0 - 100.0 fL   MCH 26.9 26.0 - 34.0 pg   MCHC 32.9 30.0 - 36.0 g/dL   RDW 82.9 56.2 - 13.0 %   Platelets 251 150 - 400 K/uL   nRBC 0.0 0.0 - 0.2 %   Neutrophils Relative % 74 %   Neutro Abs 6.0 1.7 - 7.7 K/uL   Lymphocytes Relative 17 %   Lymphs Abs 1.4 0.7 - 4.0 K/uL   Monocytes Relative 7 %   Monocytes Absolute 0.6 0.1 - 1.0 K/uL   Eosinophils Relative 1 %   Eosinophils Absolute 0.1 0.0 - 0.5 K/uL   Basophils Relative 0 %   Basophils Absolute 0.0 0.0 - 0.1 K/uL   Immature Granulocytes 1 %   Abs Immature Granulocytes 0.04 0.00 - 0.07 K/uL     Constitutional: NAD, AAOx3  HE/ENT: extraocular movements grossly intact, moist mucous membranes CV: RRR PULM: nl respiratory effort, CTABL     Abd: gravid, non-tender, non-distended, soft      Ext: Non-tender, Nonedematous   Psych: mood appropriate, speech normal Pelvic: deferred  Fetal  monitoring: Cat 1 Appropriate for GA Baseline: 140 Variability: moderate Accelerations: present x >2 Decelerations absent  A/P: 22 y.o. [redacted]w[redacted]d here for antenatal surveillance for elevated BP at home  Principle Diagnosis:  Normal pregnancy 3rd trimester  Pre-eclampsia: not present, BP WNL, labs WNL, small amount of protein present in urine but not concerning at this time. Advised to monitor for [facility] warning signs of headaches, changes of  vision, and/or RUQ pain. Labor: not present.  Fetal Wellbeing: Reassuring Cat 1 tracing. Reactive NST  D/c home stable, precautions reviewed, follow-up as scheduled.    Janyce Llanos, CNM 01/19/2023 8:14 AM

## 2023-01-19 NOTE — OB Triage Note (Signed)
[name] has been discharged home per order.  She is stable and ambulatory and an After Visit Summary was reviewed with the patient. Discharge education completed with patient/family including follow up instructions and plan to call for an appointment when the office opens Tuesday. She received labor, pre eclampsia, and bleeding precautions. Patient able to verbalize understanding, all questions fully answered upon discharge. Patient instructed to return to ED, call 911, or call MD for any changes in condition. Pt discharged home via personal vehicle with support person and all belongings.

## 2023-01-28 NOTE — H&P (Addendum)
[name] is a 22 y.o. female presenting for  scheduled for repeat LTCS  02/17/23 EGA 39+1 weeks . OB History     Gravida  2   Para  1   Term  1   Preterm      AB      Living  1      SAB      IAB      Ectopic      Multiple  0   Live Births  1          Past Medical History:  Diagnosis Date   Anxiety    History of chlamydia    treated   History of trichomoniasis    treated   History of urinary tract infection    Irregular heartbeat    Opioid abuse (HCC) 11/14/2019   Vasovagal syncope    Past Surgical History:  Procedure Laterality Date   CESAREAN SECTION N/A 07/17/2021   Procedure: CESAREAN SECTION;  Surgeon: Feliberto Gottron, Ihor Austin, MD;  Location: [facility] ORS;  Service: Obstetrics;  Laterality: N/A;   Family History: family history includes COPD in her maternal grandmother; Cancer in her maternal grandfather; Depression in her maternal grandmother; Heart disease in her paternal grandfather; Liver cancer in her maternal grandfather; Lung cancer in her maternal grandfather; Multiple sclerosis in her paternal grandmother; Schizophrenia in her maternal grandmother. Social History:  reports that she has quit smoking. Her smoking use included cigars and cigarettes. She has a 6 pack-year smoking history. She has never used smokeless tobacco. She reports current alcohol use. She reports current drug use. Drugs: Marijuana, Heroin, and Fentanyl. 22 y.o. G2P1000 at [redacted]w[redacted]d based on LMP of 05/19/22, with an Estimated Date of Delivery: 02/23/23. Sex of baby and name:  Boy "Sharlet Salina"   Partner:    Ben  Factors complicating this pregnancy  First child died from SIDS H/o mental health diagnoses: Anxiety and Depression Medications prior to pregnancy: none Medications during pregnancy: none Counseling:   2. Previous C/S x1 C/s done for: breech presentation by TJS Records available in Care Everywhere Delivery preference: Repeat c/section with Dr. Feliberto Gottron  Placenta is  anterior, but away from lower uterine segment per anatomy US   3. History of substance use Substance choice: Heroin - last use 09/04/2020  Fentanyl - 05/2020  Percocet - 05/2020 Xanax - 05/2020 THC - 10/2020  4. Took baby aspirin in previous pregnancy, will continue it in this pregnancy  5. Enlarged left axillae/breast cyst? Only occurs in left axillae when she is on her period or pregnant Reports this pregnancy it is larger than usual and very painful, soft 10/08/22: sent for breast US  6. Uterine Size-Dates Discrepancy with anatomy US 10/08/22: Anatomy US shows EFW 297g (12%), AC 20% 10/25/22 EFW 489 g 64%   Screening results and needs: NOB:  Medicaid Questionnaire: reviewed []  ACHD Program Depression Score:7 MBT: O positive      Ab screen: Neg HIV: Neg/Neg     RPR: N/R   Hep B: Neg    Hep C: N/R Pap: 12/26/2020 NILM  G/C: Neg/Neg Rubella: Immune    VZV: Immune TSH: 0.073   HgA1C: 4.9 Aneuploidy:  First trimester:  MaternitT21: Negative  Second trimester (AFP/tetra): Declined 28 weeks:  Review Medicaid Questionnaire: []  ACHD Program no changes Depression Score: 4  Blood consent: signed [facility] 12/30/22 Hgb: 10.5  Platelets: 254   Glucola: 187  Rhogam: N/A 3hr GTT: 95, 222, 157, 112 36 weeks:  GBS:  G/C:   Hgb:  Platelets:    HIV: RPR:    Last [place]:  07/17/22: Uterus anteverted; possible arcuate, Single, viable  IUP, S=[redacted]w[redacted]d, Yolk sac and, amnion seen, FHR=166bpm, Cervical length=4.20cm, Rt ovary appears wnl, Lt ovary appears wnl, No free fluid seen 10/08/22: Anatomy US, Single, viable IUP, S= [redacted]w[redacted]d, FHR= 160bpm, Cervical length= 4.13cm, B/L ovaries appear wnl, Placenta: Anterior, Position: Variable, ending Breech, Anatomic Survey is complete and appears wnl Immunization:   Flu in season -n/a Tdap at 27-36 weeks - given [facility] 12/30/22 Covid-19 -  RSV at 32-36 weeks -  Contraception Plan:  Feeding Plan:  Labor Plans:          Review of Systems  Neurological:  Speech  difficulty: neg.   History   Last menstrual period 05/19/2022, unknown if currently breastfeeding. Exam Physical Exam120/79  Lungs CTA   CV RRR  Adb gravid  + keloid  Prenatal labs: ABO, Rh: --/--/O POS Performed at [facility], [address] [place], [place] [address]  [phone]) Antibody:  neg  Rubella:  imm  RPR:   nr HBsAg:   neg  HIV:   neg  GBS:   pending   Assessment/Plan: Repeat LTCS and cicatrix removal  02/17/23  The risks of cesarean section discussed with the patient included but were not limited to: bleeding which may require transfusion or reoperation; infection which may require antibiotics; injury to bowel, bladder, ureters or other surrounding organs; injury to the fetus; need for additional procedures including hysterectomy in the event of a life-threatening hemorrhage; placental abnormalities wth subsequent pregnancies, incisional problems, thromboembolic phenomenon and other postoperative/anesthesia complications. The patient concurred with the proposed plan, giving informed written consent for the procedure.    Preoperative prophylactic antibiotics and SCDs ordered on call to the OR.  To OR when ready.    Ihor Austin Donel Osowski 01/28/2023, 11:47 AM

## 2023-01-30 LAB — OB RESULTS CONSOLE GBS: GBS: NEGATIVE

## 2023-01-30 LAB — OB RESULTS CONSOLE GC/CHLAMYDIA
Chlamydia: NEGATIVE
Neisseria Gonorrhea: NEGATIVE

## 2023-01-30 LAB — OB RESULTS CONSOLE HIV ANTIBODY (ROUTINE TESTING): HIV: NONREACTIVE

## 2023-02-10 ENCOUNTER — Encounter
Admission: RE | Admit: 2023-02-10 | Discharge: 2023-02-10 | Disposition: A | Discharge status: Home / Self Care | Payer: Medicaid Other | Source: Ambulatory Visit | Attending: Obstetrics and Gynecology | Admitting: Obstetrics and Gynecology

## 2023-02-10 ENCOUNTER — Other Ambulatory Visit: Payer: Self-pay

## 2023-02-10 DIAGNOSIS — O24419 Gestational diabetes mellitus in pregnancy, unspecified control: Secondary | ICD-10-CM

## 2023-02-10 DIAGNOSIS — Z98891 History of uterine scar from previous surgery: Secondary | ICD-10-CM

## 2023-02-10 DIAGNOSIS — Z01812 Encounter for preprocedural laboratory examination: Secondary | ICD-10-CM

## 2023-02-10 NOTE — Patient Instructions (Addendum)
Your procedure is scheduled on:  Monday,Sept 30  Arrival Time: Please call Labor and Delivery the day before your scheduled C-Section to find out your arrival time. [phone].  Arrival: If your arrival time is prior to 6:00 am, please enter through the Emergency Room Entrance and you will be directed to Labor and Delivery. If your arrival time is 6:00 am or later, please enter the Medical Mall and follow the greeter's instructions.  REMEMBER: Instructions that are not followed completely may result in serious medical risk, up to and including death; or upon the discretion of your surgeon and anesthesiologist your surgery may need to be rescheduled.  Do not eat food after midnight the night before surgery.  No gum chewing or hard candies.   One week prior to surgery: Stop Anti-inflammatories (NSAIDS) such as Advil, Aleve, Ibuprofen, Motrin, Naproxen, Naprosyn and Aspirin based products such as Excedrin, Goody's Powder, BC Powder. Stop ANY OVER THE COUNTER supplements until after surgery. You may however, continue to take Tylenol if needed for pain up until the day of surgery.  Continue taking you medications.   TAKE ONLY THESE MEDICATIONS THE MORNING OF SURGERY WITH A SIP OF WATER:  Pre-natal Vit-C    No Alcohol for 24 hours before or after surgery.  No Smoking including e-cigarettes for 24 hours prior to surgery.  No chewable tobacco products for at least 6 hours prior to surgery.  No nicotine patches on the day of surgery.  Do not use any "recreational" drugs for at least a week prior to your surgery.  Please be advised that the combination of cocaine and anesthesia may have negative outcomes, up to and including death. If you test positive for cocaine, your surgery will be cancelled.  On the morning of surgery brush your teeth with toothpaste and water, you may rinse your mouth with mouthwash if you wish. Do not swallow any toothpaste or mouthwash.  Use CHG wipes as  directed on instruction sheet.  Do not wear jewelry, make-up, hairpins, clips or nail polish.  For welded (permanent) jewelry: bracelets, anklets, waist bands, etc.  Please have this removed prior to surgery.  If it is not removed, there is a chance that hospital personnel will need to cut it off on the day of surgery.  Do not wear lotions, powders, or perfumes.   Do not shave body hair from the neck down 48 hours before surgery.  Contact lenses, hearing aids and dentures may not be worn into surgery.  Do not bring valuables to the hospital. [facility] is not responsible for any missing/lost belongings or valuables.   Notify your doctor if there is any change in your medical condition (cold, fever, infection).  Wear comfortable clothing (specific to your surgery type) to the hospital.  After surgery, you can help prevent lung complications by doing breathing exercises.  Take deep breaths and cough every 1-2 hours. Your doctor may order a device called an Incentive Spirometer to help you take deep breaths. When coughing or sneezing, hold a pillow firmly against your incision with both hands. This is called "splinting." Doing this helps protect your incision. It also decreases belly discomfort.  Please call the Pre-admissions Testing Dept. at [phone] if you have any questions about these instructions.  Surgery Visitation Policy:  Visitor Passes   All visitors, including children, need an identification sticker when visiting. These stickers must be worn where they can be seen.   Labor & Delivery  Laboring women may have one  designated support person and two other visitors of any age visit. The support person must remain the same. The visitors may switch with other visitors. Visitation is permitted 24 hours per day. The designated support person or a visitor over the age of 16 may sleep overnight in the patient's room. A doula registered with [place] for labor and  delivery support is not considered a visitor. Doulas not registered with [place] are considered visitors.  Mother Baby Unit, OB Specialty and Gynecological Care  A designated support person and three visitors of any age may visit. The three visitors may switch out. The designated support person or a visitor age 73 or older may stay overnight in the room. During the postpartum period (up to 6 weeks), if the mother is the patient, she can have her newborn stay with her if there is another support person present who can be responsible for the baby.        Preparing for Surgery with CHLORHEXIDINE GLUCONATE (CHG) Soap  Chlorhexidine Gluconate (CHG) Soap  o An antiseptic cleaner that kills germs and bonds with the skin to continue killing germs even after washing  o Used for showering the night before surgery and morning of surgery  Before surgery, you can play an important role by reducing the number of germs on your skin.  CHG (Chlorhexidine gluconate) soap is an antiseptic cleanser which kills germs and bonds with the skin to continue killing germs even after washing.  Please do not use if you have an allergy to CHG or antibacterial soaps. If your skin becomes reddened/irritated stop using the CHG.  1. Shower the NIGHT BEFORE SURGERY and the MORNING OF SURGERY with CHG soap.  2. If you choose to wash your hair, wash your hair first as usual with your normal shampoo.  3. After shampooing, rinse your hair and body thoroughly to remove the shampoo.  4. Use CHG as you would any other liquid soap. You can apply CHG directly to the skin and wash gently with a scrungie or a clean washcloth.  5. Apply the CHG soap to your body only from the neck down. Do not use on open wounds or open sores. Avoid contact with your eyes, ears, mouth, and genitals (private parts). Wash face and genitals (private parts) with your normal soap.  6. Wash thoroughly, paying special attention to the area where  your surgery will be performed.  7. Thoroughly rinse your body with warm water.  8. Do not shower/wash with your normal soap after using and rinsing off the CHG soap.  9. Pat yourself dry with a clean towel.  10. Wear clean pajamas to bed the night before surgery.  12. Place clean sheets on your bed the night of your first shower and do not sleep with pets.  13. Shower again with the CHG soap on the day of surgery prior to arriving at the hospital.  14. Do not apply any deodorants/lotions/powders.  15. Please wear clean clothes to the hospital.

## 2023-02-11 ENCOUNTER — Encounter
Admission: RE | Admit: 2023-02-11 | Discharge: 2023-02-11 | Disposition: A | Discharge status: Home / Self Care | Payer: Medicaid Other | Source: Ambulatory Visit | Attending: Obstetrics and Gynecology | Admitting: Obstetrics and Gynecology

## 2023-02-11 DIAGNOSIS — Z01818 Encounter for other preprocedural examination: Secondary | ICD-10-CM | POA: Diagnosis present

## 2023-02-11 DIAGNOSIS — Z01812 Encounter for preprocedural laboratory examination: Secondary | ICD-10-CM | POA: Insufficient documentation

## 2023-02-11 LAB — TYPE AND SCREEN
ABO/RH(D): O POS
Antibody Screen: NEGATIVE
Extend sample reason: UNDETERMINED

## 2023-02-11 LAB — BASIC METABOLIC PANEL
Anion gap: 8 (ref 5–15)
BUN: 10 mg/dL (ref 6–20)
CO2: 19 mmol/L — ABNORMAL LOW (ref 22–32)
Calcium: 9.2 mg/dL (ref 8.9–10.3)
Chloride: 108 mmol/L (ref 98–111)
Creatinine, Ser: 0.6 mg/dL (ref 0.44–1.00)
GFR, Estimated: >60 mL/min (ref >60)
Glucose, Bld: 100 mg/dL — ABNORMAL HIGH (ref 70–99)
Potassium: 4 mmol/L (ref 3.5–5.1)
Sodium: 135 mmol/L (ref 135–145)

## 2023-02-11 LAB — CBC
HCT: 32.8 % — ABNORMAL LOW (ref 36.0–46.0)
Hemoglobin: 10.7 g/dL — ABNORMAL LOW (ref 12.0–15.0)
MCH: 26.1 pg (ref 26.0–34.0)
MCHC: 32.6 g/dL (ref 30.0–36.0)
MCV: 80 fL (ref 80.0–100.0)
Platelets: 260 10*3/uL (ref 150–400)
RBC: 4.1 MIL/uL (ref 3.87–5.11)
RDW: 13.2 % (ref 11.5–15.5)
WBC: 7.2 10*3/uL (ref 4.0–10.5)
nRBC: 0 % (ref 0.0–0.2)

## 2023-02-12 ENCOUNTER — Inpatient Hospital Stay: Admission: RE | Admit: 2023-02-12 | Payer: Medicaid Other | Source: Ambulatory Visit

## 2023-02-12 LAB — RPR: RPR Ser Ql: NONREACTIVE

## 2023-02-16 ENCOUNTER — Encounter: Payer: Self-pay | Admitting: Obstetrics and Gynecology

## 2023-02-16 ENCOUNTER — Inpatient Hospital Stay: Payer: Medicaid Other | Admitting: Urgent Care

## 2023-02-16 ENCOUNTER — Encounter: Admission: EM | Disposition: A | Discharge status: Home / Self Care | Payer: Self-pay | Source: Home / Self Care

## 2023-02-16 ENCOUNTER — Inpatient Hospital Stay: Payer: Medicaid Other | Admitting: Anesthesiology

## 2023-02-16 ENCOUNTER — Inpatient Hospital Stay
Admission: EM | Admit: 2023-02-16 | Discharge: 2023-02-18 | DRG: 787 | Disposition: A | Discharge status: Home / Self Care | Payer: Medicaid Other

## 2023-02-16 ENCOUNTER — Other Ambulatory Visit: Payer: Self-pay

## 2023-02-16 DIAGNOSIS — D62 Acute posthemorrhagic anemia: Secondary | ICD-10-CM | POA: Diagnosis not present

## 2023-02-16 DIAGNOSIS — Z8249 Family history of ischemic heart disease and other diseases of the circulatory system: Secondary | ICD-10-CM | POA: Diagnosis not present

## 2023-02-16 DIAGNOSIS — Z87891 Personal history of nicotine dependence: Secondary | ICD-10-CM

## 2023-02-16 DIAGNOSIS — R339 Retention of urine, unspecified: Secondary | ICD-10-CM | POA: Diagnosis not present

## 2023-02-16 DIAGNOSIS — Z3A39 39 weeks gestation of pregnancy: Secondary | ICD-10-CM

## 2023-02-16 DIAGNOSIS — O9081 Anemia of the puerperium: Secondary | ICD-10-CM | POA: Diagnosis not present

## 2023-02-16 DIAGNOSIS — O34211 Maternal care for low transverse scar from previous cesarean delivery: Secondary | ICD-10-CM | POA: Diagnosis present

## 2023-02-16 DIAGNOSIS — O34219 Maternal care for unspecified type scar from previous cesarean delivery: Principal | ICD-10-CM | POA: Diagnosis present

## 2023-02-16 DIAGNOSIS — Z820 Family history of epilepsy and other diseases of the nervous system: Secondary | ICD-10-CM

## 2023-02-16 DIAGNOSIS — O24419 Gestational diabetes mellitus in pregnancy, unspecified control: Secondary | ICD-10-CM

## 2023-02-16 DIAGNOSIS — Z825 Family history of asthma and other chronic lower respiratory diseases: Secondary | ICD-10-CM

## 2023-02-16 DIAGNOSIS — O1404 Mild to moderate pre-eclampsia, complicating childbirth: Secondary | ICD-10-CM | POA: Diagnosis present

## 2023-02-16 DIAGNOSIS — Z87440 Personal history of urinary (tract) infections: Secondary | ICD-10-CM | POA: Diagnosis not present

## 2023-02-16 DIAGNOSIS — Z818 Family history of other mental and behavioral disorders: Secondary | ICD-10-CM

## 2023-02-16 DIAGNOSIS — O1490 Unspecified pre-eclampsia, unspecified trimester: Secondary | ICD-10-CM | POA: Diagnosis present

## 2023-02-16 DIAGNOSIS — O0990 Supervision of high risk pregnancy, unspecified, unspecified trimester: Secondary | ICD-10-CM

## 2023-02-16 DIAGNOSIS — O0993 Supervision of high risk pregnancy, unspecified, third trimester: Secondary | ICD-10-CM

## 2023-02-16 DIAGNOSIS — O26893 Other specified pregnancy related conditions, third trimester: Secondary | ICD-10-CM | POA: Diagnosis present

## 2023-02-16 DIAGNOSIS — Z98891 History of uterine scar from previous surgery: Secondary | ICD-10-CM

## 2023-02-16 DIAGNOSIS — Z3480 Encounter for supervision of other normal pregnancy, unspecified trimester: Secondary | ICD-10-CM

## 2023-02-16 DIAGNOSIS — Z01812 Encounter for preprocedural laboratory examination: Secondary | ICD-10-CM

## 2023-02-16 LAB — COMPREHENSIVE METABOLIC PANEL
ALT: 11 U/L (ref 0–44)
AST: 19 U/L (ref 15–41)
Albumin: 2.9 g/dL — ABNORMAL LOW (ref 3.5–5.0)
Alkaline Phosphatase: 189 U/L — ABNORMAL HIGH (ref 38–126)
Anion gap: 10 (ref 5–15)
BUN: 10 mg/dL (ref 6–20)
CO2: 18 mmol/L — ABNORMAL LOW (ref 22–32)
Calcium: 9.1 mg/dL (ref 8.9–10.3)
Chloride: 109 mmol/L (ref 98–111)
Creatinine, Ser: 0.51 mg/dL (ref 0.44–1.00)
GFR, Estimated: >60 mL/min (ref >60)
Glucose, Bld: 84 mg/dL (ref 70–99)
Potassium: 4.1 mmol/L (ref 3.5–5.1)
Sodium: 137 mmol/L (ref 135–145)
Total Bilirubin: 0.5 mg/dL (ref 0.3–1.2)
Total Protein: 6.4 g/dL — ABNORMAL LOW (ref 6.5–8.1)

## 2023-02-16 LAB — TYPE AND SCREEN
ABO/RH(D): O POS
Antibody Screen: NEGATIVE

## 2023-02-16 LAB — CBC
HCT: 34.2 % — ABNORMAL LOW (ref 36.0–46.0)
Hemoglobin: 11.2 g/dL — ABNORMAL LOW (ref 12.0–15.0)
MCH: 25.8 pg — ABNORMAL LOW (ref 26.0–34.0)
MCHC: 32.7 g/dL (ref 30.0–36.0)
MCV: 78.8 fL — ABNORMAL LOW (ref 80.0–100.0)
Platelets: 256 10*3/uL (ref 150–400)
RBC: 4.34 MIL/uL (ref 3.87–5.11)
RDW: 13.3 % (ref 11.5–15.5)
WBC: 9 10*3/uL (ref 4.0–10.5)
nRBC: 0 % (ref 0.0–0.2)

## 2023-02-16 LAB — PROTEIN / CREATININE RATIO, URINE
Creatinine, Urine: 170 mg/dL
Protein Creatinine Ratio: 0.43 mg/mg{creat} — ABNORMAL HIGH (ref 0.00–0.15)
Total Protein, Urine: 73 mg/dL

## 2023-02-16 SURGERY — Surgical Case
Anesthesia: Spinal

## 2023-02-16 MED ORDER — OXYTOCIN-SODIUM CHLORIDE 30-0.9 UT/500ML-% IV SOLN
INTRAVENOUS | Status: DC | PRN
  Administered 2023-02-16: 30 [IU] via INTRAVENOUS

## 2023-02-16 MED ORDER — DEXAMETHASONE SODIUM PHOSPHATE 10 MG/ML IJ SOLN
INTRAMUSCULAR | Status: DC | PRN
Start: 2023-02-16 — End: 2023-02-16
  Administered 2023-02-16: 10 mg via INTRAVENOUS

## 2023-02-16 MED ORDER — SODIUM CHLORIDE 0.9% FLUSH: 3.0000 mL | INTRAVENOUS | Status: DC | PRN

## 2023-02-16 MED ORDER — KETOROLAC TROMETHAMINE 30 MG/ML IJ SOLN
30.0000 mg | Freq: Four times a day (QID) | INTRAMUSCULAR | Status: AC | PRN
  Administered 2023-02-16 – 2023-02-17 (×3): 30 mg via INTRAVENOUS
  Filled 2023-02-16 (×3): qty 1

## 2023-02-16 MED ORDER — PRENATAL MULTIVITAMIN CH
1.0000 | ORAL_TABLET | Freq: Every day | ORAL | Status: DC
  Administered 2023-02-16 – 2023-02-18 (×3): 1 via ORAL
  Filled 2023-02-16 (×3): qty 1

## 2023-02-16 MED ORDER — CHLORHEXIDINE GLUCONATE 0.12 % MT SOLN
15.0000 mL | Freq: Once | OROMUCOSAL | Status: AC
  Administered 2023-02-16: 15 mL via OROMUCOSAL
  Filled 2023-02-16: qty 15

## 2023-02-16 MED ORDER — MORPHINE SULFATE (PF) 0.5 MG/ML IJ SOLN
INTRAMUSCULAR | Status: DC | PRN
  Administered 2023-02-16: .1 mg via INTRATHECAL

## 2023-02-16 MED ORDER — MORPHINE SULFATE (PF) 0.5 MG/ML IJ SOLN
INTRAMUSCULAR | Status: AC
  Filled 2023-02-16: qty 10

## 2023-02-16 MED ORDER — SCOPOLAMINE 1 MG/3DAYS TD PT72: 1.0000 | MEDICATED_PATCH | Freq: Once | TRANSDERMAL | Status: DC

## 2023-02-16 MED ORDER — DIPHENHYDRAMINE HCL 25 MG PO CAPS: 25.0000 mg | ORAL_CAPSULE | Freq: Four times a day (QID) | ORAL | Status: DC | PRN

## 2023-02-16 MED ORDER — SIMETHICONE 80 MG PO CHEW
80.0000 mg | CHEWABLE_TABLET | Freq: Three times a day (TID) | ORAL | Status: DC
  Administered 2023-02-16 – 2023-02-18 (×6): 80 mg via ORAL
  Filled 2023-02-16 (×7): qty 1

## 2023-02-16 MED ORDER — MEPERIDINE HCL 25 MG/ML IJ SOLN: 6.2500 mg | INTRAMUSCULAR | Status: DC | PRN

## 2023-02-16 MED ORDER — PHENYLEPHRINE 80 MCG/ML (10ML) SYRINGE FOR IV PUSH (FOR BLOOD PRESSURE SUPPORT)
PREFILLED_SYRINGE | INTRAVENOUS | Status: DC | PRN
  Administered 2023-02-16: 40 ug via INTRAVENOUS
  Administered 2023-02-16: 80 ug via INTRAVENOUS

## 2023-02-16 MED ORDER — SENNOSIDES-DOCUSATE SODIUM 8.6-50 MG PO TABS
2.0000 | ORAL_TABLET | ORAL | Status: DC
  Administered 2023-02-16 – 2023-02-18 (×3): 2 via ORAL
  Filled 2023-02-16 (×3): qty 2

## 2023-02-16 MED ORDER — ACETAMINOPHEN 500 MG PO TABS: 1000.0000 mg | ORAL_TABLET | Freq: Once | ORAL | Status: DC

## 2023-02-16 MED ORDER — BUPIVACAINE IN DEXTROSE 0.75-8.25 % IT SOLN
INTRATHECAL | Status: DC | PRN
  Administered 2023-02-16: 1.6 mL via INTRATHECAL

## 2023-02-16 MED ORDER — OXYCODONE HCL 5 MG/5ML PO SOLN
5.0000 mg | Freq: Once | ORAL | Status: DC | PRN
  Filled 2023-02-16: qty 5

## 2023-02-16 MED ORDER — ONDANSETRON HCL 4 MG/2ML IJ SOLN
INTRAMUSCULAR | Status: DC | PRN
  Administered 2023-02-16: 4 mg via INTRAVENOUS

## 2023-02-16 MED ORDER — OXYCODONE HCL 5 MG PO TABS
5.0000 mg | ORAL_TABLET | Freq: Four times a day (QID) | ORAL | Status: DC | PRN
  Administered 2023-02-17 – 2023-02-18 (×3): 5 mg via ORAL
  Filled 2023-02-16 (×3): qty 1

## 2023-02-16 MED ORDER — KETOROLAC TROMETHAMINE 30 MG/ML IJ SOLN: 30.0000 mg | Freq: Four times a day (QID) | INTRAMUSCULAR | Status: AC | PRN

## 2023-02-16 MED ORDER — SOD CITRATE-CITRIC ACID 500-334 MG/5ML PO SOLN
ORAL | Status: AC
  Administered 2023-02-16: 30 mL via ORAL
  Filled 2023-02-16: qty 15

## 2023-02-16 MED ORDER — DIPHENHYDRAMINE HCL 50 MG/ML IJ SOLN: 12.5000 mg | INTRAMUSCULAR | Status: DC | PRN

## 2023-02-16 MED ORDER — OXYTOCIN-SODIUM CHLORIDE 30-0.9 UT/500ML-% IV SOLN
INTRAVENOUS | Status: AC
  Filled 2023-02-16: qty 500

## 2023-02-16 MED ORDER — FENTANYL CITRATE (PF) 100 MCG/2ML IJ SOLN
INTRAMUSCULAR | Status: AC
  Filled 2023-02-16: qty 2

## 2023-02-16 MED ORDER — WITCH HAZEL-GLYCERIN EX PADS: 1.0000 | MEDICATED_PAD | CUTANEOUS | Status: DC | PRN

## 2023-02-16 MED ORDER — OXYCODONE HCL 5 MG PO TABS: 5.0000 mg | ORAL_TABLET | Freq: Once | ORAL | Status: DC | PRN

## 2023-02-16 MED ORDER — KETOROLAC TROMETHAMINE 30 MG/ML IJ SOLN
INTRAMUSCULAR | Status: DC | PRN
  Administered 2023-02-16: 30 mg via INTRAVENOUS

## 2023-02-16 MED ORDER — CEFAZOLIN SODIUM-DEXTROSE 2-4 GM/100ML-% IV SOLN
2.0000 g | INTRAVENOUS | Status: AC
  Administered 2023-02-16: 2 g via INTRAVENOUS
  Filled 2023-02-16: qty 100

## 2023-02-16 MED ORDER — MENTHOL 3 MG MT LOZG: 1.0000 | LOZENGE | OROMUCOSAL | Status: DC | PRN

## 2023-02-16 MED ORDER — LACTATED RINGERS IV SOLN: INTRAVENOUS | Status: DC

## 2023-02-16 MED ORDER — IBUPROFEN 600 MG PO TABS
600.0000 mg | ORAL_TABLET | Freq: Four times a day (QID) | ORAL | Status: DC
  Administered 2023-02-17 – 2023-02-18 (×5): 600 mg via ORAL
  Filled 2023-02-16 (×5): qty 1

## 2023-02-16 MED ORDER — PHENYLEPHRINE 80 MCG/ML (10ML) SYRINGE FOR IV PUSH (FOR BLOOD PRESSURE SUPPORT)
PREFILLED_SYRINGE | INTRAVENOUS | Status: AC
  Filled 2023-02-16: qty 10

## 2023-02-16 MED ORDER — ACETAMINOPHEN 500 MG PO TABS
1000.0000 mg | ORAL_TABLET | Freq: Once | ORAL | Status: AC
  Administered 2023-02-16: 1000 mg via ORAL
  Filled 2023-02-16: qty 2

## 2023-02-16 MED ORDER — GABAPENTIN 300 MG PO CAPS
300.0000 mg | ORAL_CAPSULE | Freq: Two times a day (BID) | ORAL | Status: DC
  Administered 2023-02-17 – 2023-02-18 (×3): 300 mg via ORAL
  Filled 2023-02-16 (×3): qty 1

## 2023-02-16 MED ORDER — FENTANYL CITRATE (PF) 100 MCG/2ML IJ SOLN: 25.0000 ug | INTRAMUSCULAR | Status: DC | PRN

## 2023-02-16 MED ORDER — ORAL CARE MOUTH RINSE: 15.0000 mL | Freq: Once | OROMUCOSAL | Status: AC

## 2023-02-16 MED ORDER — FERROUS SULFATE 325 (65 FE) MG PO TABS
325.0000 mg | ORAL_TABLET | Freq: Two times a day (BID) | ORAL | Status: DC
  Administered 2023-02-16 – 2023-02-18 (×4): 325 mg via ORAL
  Filled 2023-02-16 (×4): qty 1

## 2023-02-16 MED ORDER — DEXMEDETOMIDINE HCL IN NACL 80 MCG/20ML IV SOLN
INTRAVENOUS | Status: DC | PRN
Start: 2023-02-16 — End: 2023-02-16
  Administered 2023-02-16: 8 ug via INTRAVENOUS
  Administered 2023-02-16: 4 ug via INTRAVENOUS

## 2023-02-16 MED ORDER — ONDANSETRON HCL 4 MG/2ML IJ SOLN: 4.0000 mg | Freq: Three times a day (TID) | INTRAMUSCULAR | Status: DC | PRN

## 2023-02-16 MED ORDER — FENTANYL CITRATE (PF) 100 MCG/2ML IJ SOLN
INTRAMUSCULAR | Status: DC | PRN
  Administered 2023-02-16: 15 ug via INTRATHECAL

## 2023-02-16 MED ORDER — PHENYLEPHRINE HCL-NACL 20-0.9 MG/250ML-% IV SOLN
INTRAVENOUS | Status: DC | PRN
  Administered 2023-02-16: 50 ug/min via INTRAVENOUS

## 2023-02-16 MED ORDER — SODIUM CHLORIDE 0.9 % IV SOLN
500.0000 mg | INTRAVENOUS | Status: AC
  Administered 2023-02-16: 500 mg via INTRAVENOUS
  Filled 2023-02-16: qty 5

## 2023-02-16 MED ORDER — KETOROLAC TROMETHAMINE 30 MG/ML IJ SOLN
INTRAMUSCULAR | Status: AC
  Filled 2023-02-16: qty 1

## 2023-02-16 MED ORDER — COCONUT OIL OIL: 1.0000 | TOPICAL_OIL | Status: DC | PRN

## 2023-02-16 MED ORDER — FAMOTIDINE 20 MG PO TABS
20.0000 mg | ORAL_TABLET | Freq: Once | ORAL | Status: AC
  Administered 2023-02-16: 20 mg via ORAL
  Filled 2023-02-16: qty 1

## 2023-02-16 MED ORDER — DIPHENHYDRAMINE HCL 25 MG PO CAPS: 25.0000 mg | ORAL_CAPSULE | ORAL | Status: DC | PRN

## 2023-02-16 MED ORDER — NALOXONE HCL 4 MG/10ML IJ SOLN: 1.0000 ug/kg/h | INTRAVENOUS | Status: DC | PRN

## 2023-02-16 MED ORDER — GABAPENTIN 300 MG PO CAPS
300.0000 mg | ORAL_CAPSULE | Freq: Once | ORAL | Status: AC
  Administered 2023-02-16: 300 mg via ORAL
  Filled 2023-02-16: qty 1

## 2023-02-16 MED ORDER — 0.9 % SODIUM CHLORIDE (POUR BTL) OPTIME
TOPICAL | Status: DC | PRN
  Administered 2023-02-16: 1000 mL

## 2023-02-16 MED ORDER — BUPIVACAINE HCL (PF) 0.5 % IJ SOLN
INTRAMUSCULAR | Status: AC
  Filled 2023-02-16: qty 30

## 2023-02-16 MED ORDER — SOD CITRATE-CITRIC ACID 500-334 MG/5ML PO SOLN: 30.0000 mL | ORAL | Status: AC

## 2023-02-16 MED ORDER — NALOXONE HCL 0.4 MG/ML IJ SOLN: 0.4000 mg | INTRAMUSCULAR | Status: DC | PRN

## 2023-02-16 MED ORDER — DIBUCAINE (PERIANAL) 1 % EX OINT: 1.0000 | TOPICAL_OINTMENT | CUTANEOUS | Status: DC | PRN

## 2023-02-16 MED ORDER — ACETAMINOPHEN 500 MG PO TABS
1000.0000 mg | ORAL_TABLET | Freq: Four times a day (QID) | ORAL | Status: AC
  Administered 2023-02-16 – 2023-02-17 (×4): 1000 mg via ORAL
  Filled 2023-02-16 (×4): qty 2

## 2023-02-16 MED ORDER — GABAPENTIN 300 MG PO CAPS: 300.0000 mg | ORAL_CAPSULE | Freq: Once | ORAL | Status: DC

## 2023-02-16 MED ORDER — OXYTOCIN-SODIUM CHLORIDE 30-0.9 UT/500ML-% IV SOLN: 2.5000 [IU]/h | INTRAVENOUS | Status: AC

## 2023-02-16 MED ORDER — DEXMEDETOMIDINE HCL IN NACL 80 MCG/20ML IV SOLN
INTRAVENOUS | Status: AC
  Filled 2023-02-16: qty 20

## 2023-02-16 SURGICAL SUPPLY — 37 items
ADH SKN CLS LQ APL DERMABOND (GAUZE/BANDAGES/DRESSINGS) ×1
APL PRP STRL LF DISP 70% ISPRP (MISCELLANEOUS) ×1
BARRIER ADHS 3X4 INTERCEED (GAUZE/BANDAGES/DRESSINGS) ×1 IMPLANT
BRR ADH 4X3 ABS CNTRL BYND (GAUZE/BANDAGES/DRESSINGS) ×1
CHLORAPREP W/TINT 26 (MISCELLANEOUS) ×1 IMPLANT
DERMABOND ADVANCED .7 DNX6 (GAUZE/BANDAGES/DRESSINGS) IMPLANT
DRSG OPSITE POSTOP 4X10 (GAUZE/BANDAGES/DRESSINGS) IMPLANT
DRSG TELFA 3X8 NADH STRL (GAUZE/BANDAGES/DRESSINGS) ×1 IMPLANT
ELECT CAUTERY BLADE 6.4 (BLADE) ×1 IMPLANT
ELECT REM PT RETURN 9FT ADLT (ELECTROSURGICAL) ×1
ELECTRODE REM PT RTRN 9FT ADLT (ELECTROSURGICAL) ×1 IMPLANT
GAUZE SPONGE 4X4 12PLY STRL (GAUZE/BANDAGES/DRESSINGS) ×1 IMPLANT
GLOVE SURG SYN 8.0 (GLOVE) ×1 IMPLANT
GLOVE SURG SYN 8.0 PF PI (GLOVE) ×1 IMPLANT
GOWN STRL REUS W/ TWL LRG LVL3 (GOWN DISPOSABLE) ×2 IMPLANT
GOWN STRL REUS W/ TWL XL LVL3 (GOWN DISPOSABLE) ×1 IMPLANT
GOWN STRL REUS W/TWL LRG LVL3 (GOWN DISPOSABLE) ×2
GOWN STRL REUS W/TWL XL LVL3 (GOWN DISPOSABLE) ×1
HANDLE YANKAUER SUCT BULB TIP (MISCELLANEOUS) IMPLANT
MANIFOLD NEPTUNE II (INSTRUMENTS) ×1 IMPLANT
MAT PREVALON FULL STRYKER (MISCELLANEOUS) ×1 IMPLANT
NDL HYPO 22X1.5 SAFETY MO (MISCELLANEOUS) ×1 IMPLANT
NEEDLE HYPO 22X1.5 SAFETY MO (MISCELLANEOUS) ×1 IMPLANT
NS IRRIG 1000ML POUR BTL (IV SOLUTION) ×1 IMPLANT
PACK C SECTION AR (MISCELLANEOUS) ×1 IMPLANT
PAD OB MATERNITY 4.3X12.25 (PERSONAL CARE ITEMS) ×1 IMPLANT
PAD PREP OB/GYN DISP 24X41 (PERSONAL CARE ITEMS) ×1 IMPLANT
SCRUB CHG 4% DYNA-HEX 4OZ (MISCELLANEOUS) ×1 IMPLANT
STRAP SAFETY 5IN WIDE (MISCELLANEOUS) ×1 IMPLANT
SUT CHROMIC 1 CTX 36 (SUTURE) ×3 IMPLANT
SUT PLAIN GUT 0 (SUTURE) ×2 IMPLANT
SUT VIC AB 0 CT1 36 (SUTURE) ×2 IMPLANT
SUT VICRYL 3-0 27IN (SUTURE) IMPLANT
SUTURE PLAIN GUT 2.0 ETHICON (SUTURE) IMPLANT
SYR 30ML LL (SYRINGE) ×2 IMPLANT
TRAP FLUID SMOKE EVACUATOR (MISCELLANEOUS) ×1 IMPLANT
WATER STERILE IRR 500ML POUR (IV SOLUTION) ×1 IMPLANT

## 2023-02-16 NOTE — Anesthesia Procedure Notes (Signed)
Spinal  Patient location during procedure: OR Start time: 02/16/2023 7:52 AM End time: 02/16/2023 7:57 AM Reason for block: surgical anesthesia Staffing Performed: anesthesiologist  Anesthesiologist: Reed Breech, MD Resident/CRNA: Karoline Caldwell, CRNA Performed by: Karoline Caldwell, CRNA Authorized by: Reed Breech, MD   Preanesthetic Checklist Completed: patient identified, IV checked, site marked, risks and benefits discussed, surgical consent, monitors and equipment checked, pre-op evaluation and timeout performed Spinal Block Patient position: sitting Prep: ChloraPrep Patient monitoring: heart rate, continuous pulse ox, blood pressure and cardiac monitor Approach: midline Location: L3-4 Injection technique: single-shot Needle Needle type: Whitacre and Introducer  Needle gauge: 24 G Needle length: 9 cm Assessment Sensory level: T4 Events: CSF return Additional Notes Sterile aseptic technique used throughout the procedure.  Negative paresthesia. Negative blood return. Positive free-flowing CSF. Expiration date of kit checked and confirmed. Patient tolerated procedure well, without complications.

## 2023-02-16 NOTE — Anesthesia Preprocedure Evaluation (Signed)
Anesthesia Evaluation  Patient identified by MRN, date of birth, ID band Patient awake    Reviewed: Allergy & Precautions, NPO status , Patient's Chart, lab work & pertinent test results  History of Anesthesia Complications Negative for: history of anesthetic complications  Airway Mallampati: III  TM Distance: <3 FB Neck ROM: full    Dental  (+) Chipped   Pulmonary neg pulmonary ROS, former smoker   Pulmonary exam normal        Cardiovascular Exercise Tolerance: Good (-) hypertensionnegative cardio ROS Normal cardiovascular exam     Neuro/Psych    GI/Hepatic ,GERD  ,,  Endo/Other  diabetes, Gestational    Renal/GU   negative genitourinary   Musculoskeletal   Abdominal   Peds  Hematology negative hematology ROS (+)   Anesthesia Other Findings Past Medical History: No date: Anxiety No date: History of chlamydia     Comment:  treated No date: History of trichomoniasis     Comment:  treated No date: History of urinary tract infection No date: Irregular heartbeat 11/14/2019: Opioid abuse (HCC) No date: Vasovagal syncope  Past Surgical History: 07/17/2021: CESAREAN SECTION; N/A     Comment:  Procedure: CESAREAN SECTION;  Surgeon: Suzy Bouchard, MD;  Location: [facility] ORS;  Service: Obstetrics;               Laterality: N/A; No date: cicatrix removal No date: REPEAT CESAREAN SECTION  BMI    Body Mass Index: 31.45 kg/m      Reproductive/Obstetrics (+) Pregnancy                             Anesthesia Physical Anesthesia Plan  ASA: 3  Anesthesia Plan: Spinal   Post-op Pain Management:    Induction:   PONV Risk Score and Plan:   Airway Management Planned: Natural Airway and Nasal Cannula  Additional Equipment:   Intra-op Plan:   Post-operative Plan:   Informed Consent: I have reviewed the patients History and Physical, chart, labs and discussed  the procedure including the risks, benefits and alternatives for the proposed anesthesia with the patient or authorized representative who has indicated his/her understanding and acceptance.     Dental Advisory Given  Plan Discussed with: Anesthesiologist, CRNA and Surgeon  Anesthesia Plan Comments: (Patient reports no bleeding problems and no anticoagulant use.  Plan for spinal with backup GA  Patient consented for risks of anesthesia including but not limited to:  - adverse reactions to medications - damage to eyes, teeth, lips or other oral mucosa - nerve damage due to positioning  - risk of bleeding, infection and or nerve damage from spinal that could lead to paralysis - risk of headache or failed spinal - damage to teeth, lips or other oral mucosa - sore throat or hoarseness - damage to heart, brain, nerves, lungs, other parts of body or loss of life  Patient voiced understanding.)       Anesthesia Quick Evaluation

## 2023-02-16 NOTE — Discharge Summary (Signed)
Postpartum Discharge Summary  Patient Name: [name] DOB: 2000/08/14 MRN: [id number]  Date of admission: 02/16/2023 Delivery date:02/16/2023 Delivering provider: Thomasene Mohair D Date of discharge: 02/16/2023  Primary OB: [facility] [id number] last menstrual period was 05/19/2022. EDC Estimated Date of Delivery: 02/23/23 Gestational Age at Delivery: [redacted]w[redacted]d   Admitting diagnosis: Previous cesarean delivery affecting pregnancy [O34.219] Intrauterine pregnancy: [redacted]w[redacted]d     Secondary diagnosis:   Principal Problem:   Previous cesarean delivery affecting pregnancy Active Problems:   Supervision of high-risk pregnancy   [redacted] weeks gestation of pregnancy   Discharge Diagnosis: Term Pregnancy Delivered      Hospital course: Sceduled C/S   22 y.o. yo G2P2001 at [redacted]w[redacted]d was admitted to the hospital 02/16/2023 for scheduled cesarean section with the following indication:Elective Repeat.Delivery details are as follows:  Membrane Rupture Time/Date: 3:30 AM,02/16/2023  Delivery Method:C-Section, Low Vertical Operative Delivery:N/A Details of operation can be found in separate operative note.  She was originally scheduled to have a cesarean delivery tomorrow, 02/17/2023, however, she had ROM this AM and presented to Labor and Delivery.  Given her ROM, delivery was recommended.  Patient had a postpartum course complicated by***.  She is ambulating, tolerating a regular diet, passing flatus, and urinating well. Patient is discharged home in stable condition on  02/16/23        Newborn Data: Birth date:02/16/2023 Birth time:8:30 AM Gender:Female "Andreas" Living status:Living Apgars:8 ,9  Weight:3470 g                                              Post partum procedures:{Postpartum procedures:23558} Augmentation : N/A Complications: None Delivery Type: repeat cesarean section, low transverse incision Anesthesia: spinal anesthesia Placenta: spontaneous To Pathology: No   Prenatal  Labs:  Blood type/Rh O pos  Antibody screen neg  Rubella Immune  Varicella Immune  RPR NR  HBsAg Neg  HIV NR  GC neg  Chlamydia neg  Genetic screening negative  1 hour GTT 187  3 hour GTT 95, 222, 157, 112  GBS Negative   Magnesium Sulfate received: No BMZ received: No Rhophylac:{Rhophylac received:30440032} MMR:No Varivax vaccine given: was not indicated T-DaP:{Tdap:23962} Flu: {SWN:46270}  Transfusion:{Transfusion received:30440034}  Physical exam  Vitals:   02/16/23 0416 02/16/23 0707 02/16/23 0729 02/16/23 0929  BP: 126/86 (!) 125/92 135/85 (!) 141/63  Pulse: 91 97 96 85  Resp: 20 20  15   Temp: 98.7 F (37.1 C) 98.5 F (36.9 C)  98.4 F (36.9 C)  TempSrc: Oral Oral  Oral  SpO2:  99%  98%  Weight: 96.6 kg     Height: 5\' 9"  (1.753 m)      General: {Exam; general:21111117} Lochia: {Desc; appropriate/inappropriate:30686::"appropriate"} Uterine Fundus: {Desc; firm/soft:30687} Perineum:***minimal edema/{OB Perineal assessment:24215} Incision: {Exam; incision:21111123}, covered with occlusive OP site dressing *** DVT Evaluation: {Exam; dvt:2111122}  Labs: Lab Results  Component Value Date   WBC 9.0 02/16/2023   HGB 11.2 (L) 02/16/2023   HCT 34.2 (L) 02/16/2023   MCV 78.8 (L) 02/16/2023   PLT 256 02/16/2023      Latest Ref Rng & Units 02/11/2023    3:37 PM  CMP  Glucose 70 - 99 mg/dL 350   BUN 6 - 20 mg/dL 10   Creatinine 0.93 - 1.00 mg/dL 8.18   Sodium 299 - 371 mmol/L 135   Potassium 3.5 - 5.1 mmol/L 4.0  Chloride 98 - 111 mmol/L 108   CO2 22 - 32 mmol/L 19   Calcium 8.9 - 10.3 mg/dL 9.2    Edinburgh Score:    07/17/2021    5:53 PM  Edinburgh Postnatal Depression Scale Screening Tool  I have been able to laugh and see the funny side of things. 0  I have looked forward with enjoyment to things. 0  I have blamed myself unnecessarily when things went wrong. 0  I have been anxious or worried for no good reason. 0  I have felt scared or panicky  for no good reason. 0  Things have been getting on top of me. 0  I have been so unhappy that I have had difficulty sleeping. 0  I have felt sad or miserable. 0  I have been so unhappy that I have been crying. 0  The thought of harming myself has occurred to me. 0  Edinburgh Postnatal Depression Scale Total 0    Risk assessment for postpartum VTE and prophylactic treatment: Very high risk factors: If any risk factors: 6 weeks LMHW and None High risk factors: If 1 risk factor, mechanical prophylaxis and early ambulation , If > 1 risk factor OR 1 risk factor + 1 moderate risk factor: 3-6 weeks of LMWH, and None Moderate risk factors: If 3 or more risk factors: mechanical prophylaxis and early ambulation OR 3-6 weeks of LMWH, Cesarean delivery , and BMI 30-40 kg/m2  Postpartum VTE prophylaxis with LMWH not indicated  After visit meds:  Allergies as of 02/16/2023   No Known Allergies   Med Rec must be completed prior to using this [facility]***      Discharge home in stable condition Infant Feeding: {Baby feeding:23562} Infant Disposition:home with mother Discharge instruction: per After Visit Summary and Postpartum booklet. Activity: Advance as tolerated. Pelvic rest for 6 weeks.  Diet: routine diet Anticipated Birth Control: {Birth Control:23956} Postpartum Appointment:6 weeks Additional Postpartum F/U: Incision check 1 week Future Appointments:No future appointments. Follow up Visit:  Follow-up Information     Conard Novak, MD. Schedule an appointment as soon as possible for a visit in 1 week(s).   Specialty: Obstetrics and Gynecology Why: For incision check Contact information: [address] [place] [address] [phone]                 Plan:  THRESEA DOBLE was discharged to home in good condition. Follow-up appointment as directed.    Signed: *** Hit refresh and delete this line

## 2023-02-16 NOTE — Transfer of Care (Signed)
Immediate Anesthesia Transfer of Care Note  Patient: [name]  Procedure(s) Performed: REPEAT CESAREAN SECTION  Patient Location: PACU  Anesthesia Type:Spinal  Level of Consciousness: awake, alert , and oriented  Airway & Oxygen Therapy: Patient Spontanous Breathing  Post-op Assessment: Report given to RN and Post -op Vital signs reviewed and stable  Post vital signs: Reviewed and stable  Last Vitals:  Vitals Value Taken Time  BP 141/63 02/16/23 0929  Temp 36.9 C 02/16/23 0929  Pulse 85 02/16/23 0929  Resp 15 02/16/23 0929  SpO2 98 % 02/16/23 0929    Last Pain:  Vitals:   02/16/23 0929  TempSrc: Oral  PainSc:          Complications: No notable events documented.

## 2023-02-16 NOTE — OB Triage Note (Signed)
Pt is a scheduled c/s and arrives with gross rupture of membranes. Pt states that she woke up around 0330 with LOF.

## 2023-02-16 NOTE — Op Note (Signed)
Cesarean Section Operative Note    Patient Name: [name]  DOB: 2000-11-10  MRN: [id number]  Date of Surgery: 02/16/2023   Pre-operative Diagnosis:  1) History of cesarean delivery, desires repeat 2) Prelabor rupture of membranes 3) intrauterine pregnancy at [redacted]w[redacted]d   Post-operative Diagnosis:  1) History of cesarean delivery, desires repeat 2) Prelabor rupture of membranes 3) intrauterine pregnancy at [redacted]w[redacted]d    Procedure: Repeat low transverse cesarean section  Surgeon: Surgeons and Role:    Conard Novak, MD - Primary   Assistants: Donato Schultz, CNM; No other capable assistant available, in surgery requiring high level assistant.  Anesthesia: spinal   Findings:  1) normal appearing gravid uterus, fallopian tubes, and ovaries 2) viable female infant with weight of 3,470 grams, AGPARs 8 and 9   Estimated Blood Loss: 550 mL Quantified Blood Loss: 300 mL  Total IV Fluids: 1,200 ml   Urine Output:  100 mL  Specimens: none  Complications: no complications  Disposition: PACU - hemodynamically stable.   Maternal Condition: stable   Baby condition / location:  Couplet care / Skin to Skin  Procedure Details:  The patient was seen in the Holding Room. The risks, benefits, complications, treatment options, and expected outcomes were discussed with the patient. The patient concurred with the proposed plan, giving informed consent. identified as [name] and the procedure verified as C-Section Delivery. A Time Out was held and the above information confirmed.   After induction of anesthesia, the patient was draped and prepped in the usual sterile manner. A Pfannenstiel incision was made and carried down through the subcutaneous tissue to the fascia. Fascial incision was made and extended transversely. The fascia was separated from the underlying rectus tissue superiorly and inferiorly. The peritoneum was identified and entered. Peritoneal incision was  extended longitudinally. The bladder flap was bluntly and sharply freed from the lower uterine segment. A low transverse uterine incision was made and the hysterotomy was extended with cranial-caudal tension. Delivered from cephalic presentation was a 3,470 gram Living newborn infant(s) or Female with Apgar scores of 8 at one minute and 9 at five minutes. Cord ph was not sent the umbilical cord was clamped and cut cord blood was obtained for evaluation. The placenta was removed Intact and appeared normal. The uterine outline, tubes and ovaries appeared normal. The uterine incision was closed with running locked sutures of 0 Vicryl.  A second layer of the same suture was thrown in an imbricating fashion.  Hemostasis was assured.  The uterus was returned to the abdomen and the paracolic gutters were cleared of all clots and debris.  The rectus muscles were inspected and found to be hemostatic.  The fascia was then reapproximated with running sutures of 1-0 PDS, looped. The subcutaneous tissue was reapproximated using 2-0 plain gut such that no greater than 2cm of dead space remained. The subcuticular closure was performed using 4-0 monocryl. The skin closure was reinforced using surgical skin glue. She had a mild keloidal scar that was also resected on initial incision.    The surgical assistant performed tissue retraction, assistance with suturing, and fundal pressure.  Instrument, sponge, and needle counts were correct prior the abdominal closure and were correct at the conclusion of the case.  The patient received Ancef 2 gram IV and Azithromycin 500 mg prior to skin incision (within 30 minutes). For VTE prophylaxis she was wearing SCDs throughout the case.  The assistant surgeon was a CNM due to lack of  availability of another Sales promotion account executive.    Signed: Conard Novak, MD 02/16/2023 9:29 AM

## 2023-02-16 NOTE — H&P (Addendum)
OB History & Physical   History of Present Illness:  Chief Complaint:   HPI:  [name] is a 22 y.o. G2P1000 female at [redacted]w[redacted]d dated by LMP.  She presents to L&D for leaking amniotic fluid. She states she was laying in bed and felt fluid come out. When she got up, the fluid kept pouring out and has continued to keep pouring out. She is feeling some contractions but reports they are not painful, just make her feel "unwell." She reports increased heartburn. She last ate around 11pm last night. She was scheduled for a repeat cesarean section with Dr. Feliberto Gottron for 02/17/23.   She reports good fetal movement, lots of leaking fluid. Denies bleeding or painful contractions.  Pregnancy Issues: 1. First child died from SIDS 2. History of anxiety and depression 3. Previous cesarean section x1 for breech 4. History of substance abuse (UDS in the pregnancy negative, last substance use in 2022) 5. Enlarged left axillae/breast cyst 6. Uterine size-dates discrepancy with anatomy US, normal growth [place] in June   Maternal Medical History:   Past Medical History:  Diagnosis Date   Anxiety    History of chlamydia    treated   History of trichomoniasis    treated   History of urinary tract infection    Irregular heartbeat    Opioid abuse (HCC) 11/14/2019   Vasovagal syncope     Past Surgical History:  Procedure Laterality Date   CESAREAN SECTION N/A 07/17/2021   Procedure: CESAREAN SECTION;  Surgeon: Feliberto Gottron, Ihor Austin, MD;  Location: [facility] ORS;  Service: Obstetrics;  Laterality: N/A;   cicatrix removal     REPEAT CESAREAN SECTION      No Known Allergies  Prior to Admission medications   Medication Sig Start Date End Date Taking? Authorizing Provider  acetaminophen (TYLENOL) 500 MG tablet Take 2 tablets (1,000 mg total) by mouth every 6 (six) hours. 07/19/21   Janyce Llanos, CNM  ascorbic acid (VITAMIN C) 500 MG tablet Take 500 mg by mouth daily.    [provider]  aspirin 81 MG chewable tablet Chew 81 mg by mouth daily. Patient not taking: Reported on 02/10/2023    [provider]  calcium carbonate (TUMS - DOSED IN MG ELEMENTAL CALCIUM) 500 MG chewable tablet Chew 1 tablet by mouth daily as needed for indigestion or heartburn. Patient not taking: Reported on 02/10/2023    [provider]  Prenatal Vit-Fe Fumarate-FA (PRENATAL MULTIVITAMIN) TABS tablet Take 1 tablet by mouth daily at 12 noon. 07/19/21   Janyce Llanos, CNM    Prenatal care site: [facility]  Social History: She  reports that she has quit smoking. Her smoking use included cigars and cigarettes. She has a 6 pack-year smoking history. She has never used smokeless tobacco. She reports current alcohol use. She reports current drug use. Drugs: Marijuana, Heroin, and Fentanyl.  Family History: family history includes COPD in her maternal grandmother; Cancer in her maternal grandfather; Depression in her maternal grandmother; Heart disease in her paternal grandfather; Liver cancer in her maternal grandfather; Lung cancer in her maternal grandfather; Multiple sclerosis in her paternal grandmother; Schizophrenia in her maternal grandmother.   Review of Systems: A full review of systems was performed and negative except as noted in the HPI.     Physical Exam:  Vital Signs: BP 126/86 (BP Location: Left Arm)   Pulse 91   Temp 98.7 F (37.1 C) (Oral)   Resp 20   Ht  5\' 9"  (1.753 m)   Wt 96.6 kg   LMP 05/19/2022   BMI 31.45 kg/m  General: no acute distress.  HEENT: normocephalic, atraumatic Heart: regular rate & rhythm.  No murmurs/rubs/gallops Lungs: clear to auscultation bilaterally, normal respiratory effort Abdomen: soft, gravid, non-tender;  EFW: 7.5lb Pelvic:   External: Normal external female genitalia, clear fluid noted to be pooling out of the vagina onto the bed  Cervix: Dilation: 1 / Effacement (%): 80 / Station: -3    Extremities:  non-tender, symmetric, no edema bilaterally.  DTRs: +2  Neurologic: Alert & oriented x 3.    No results found for this or any previous visit (from the past 24 hour(s)).  Pertinent Results:  Prenatal Labs: Blood type/Rh O pos  Antibody screen neg  Rubella Immune  Varicella Immune  RPR NR  HBsAg Neg  HIV NR  GC neg  Chlamydia neg  Genetic screening negative  1 hour GTT 187  3 hour GTT 95, 222, 157, 112  GBS Negative   FHT: 140bpm, moderate variability, accelerations present, intermittent variable deceleration with quick recovery TOCO: contractions q1.5-54min, palpate mild SVE:  Dilation: 1 / Effacement (%): 80 / Station: -3    Cephalic by leopolds  No results found.  Assessment:  [name] is a 22 y.o. G2P1000 female at [redacted]w[redacted]d with SROM and previous cesarean section.   Plan:  1. Admit to Labor & Delivery; consents reviewed and obtained - Dr. Jean Rosenthal notified of admission and assessment  2. Fetal Well being  - Fetal Tracing: Category II, overall reassuring, had 3 small variable decelerations with quick recovery and accelerations  - Group B Streptococcus ppx indicated: n/a, GBS negative - Presentation: vertex confirmed by leopolds   3. Routine OB: - Prenatal labs reviewed, as above - Rh positive - CBC, T&S, RPR on admit - Clear fluids, IVF  4. Repeat cesarean section -  Contractions q1.5-70min, external toco in place. She is not feeling them as painful, she is not breathing through them. -  After discussing with Dr. Jean Rosenthal, plan to perform repeat cesarean section at 8hrs NPO unless indicated sooner -  Plan for continuous fetal monitoring   5. Post Partum Planning: - Infant feeding: undecided - Contraception: undecided - Flu: out of season - Tdap: received 12/30/22 - RSV: declined  Janyce Llanos, CNM 02/16/23 4:49 AM  ATTESTATION: Patient presents with grossly ruptured amniotic fluid at around 3 am this morning.  She has continued to leak  during that time.  She notes +FM, no vaginal bleeding. She notes occasional contractions.   Pregnancy complicated by history of c-section for breech and desires repeat.    Initial blood pressure slightly elevated. Will cycle BPs and monitor.  First recheck after initial elevated 135/85.   UDS for history ,though appears to have not used any substance in at least 2 years. See H&P for more details on history.  Will proceed with repeat c-section and removal of skin scar tissue at previous incision.    Fetal tracing is category 1 and reactive Baseline FHR 160 bpm Variability: moderate Accelerations: present Decelerations: absent Toco: irritability  For post-op pain control will use gabapentin and immediate release oxycodone, as was used for her last surgery.    Consents reviewed and will proceed as soon as we're able.  Thomasene Mohair, MD, [facility] 02/16/2023 7:31 AM

## 2023-02-17 ENCOUNTER — Inpatient Hospital Stay
Admission: RE | Admit: 2023-02-17 | Payer: Medicaid Other | Source: Home / Self Care | Admitting: Obstetrics and Gynecology

## 2023-02-17 ENCOUNTER — Encounter: Payer: Self-pay | Admitting: Obstetrics and Gynecology

## 2023-02-17 DIAGNOSIS — O1490 Unspecified pre-eclampsia, unspecified trimester: Secondary | ICD-10-CM | POA: Diagnosis present

## 2023-02-17 LAB — CBC
HCT: 26.3 % — ABNORMAL LOW (ref 36.0–46.0)
Hemoglobin: 8.6 g/dL — ABNORMAL LOW (ref 12.0–15.0)
MCH: 26 pg (ref 26.0–34.0)
MCHC: 32.7 g/dL (ref 30.0–36.0)
MCV: 79.5 fL — ABNORMAL LOW (ref 80.0–100.0)
Platelets: 188 10*3/uL (ref 150–400)
RBC: 3.31 MIL/uL — ABNORMAL LOW (ref 3.87–5.11)
RDW: 13.3 % (ref 11.5–15.5)
WBC: 9.1 10*3/uL (ref 4.0–10.5)
nRBC: 0 % (ref 0.0–0.2)

## 2023-02-17 LAB — RPR: RPR Ser Ql: NONREACTIVE

## 2023-02-17 MED ORDER — SODIUM CHLORIDE 0.9 % IV SOLN
300.0000 mg | Freq: Once | INTRAVENOUS | Status: AC
  Administered 2023-02-17: 300 mg via INTRAVENOUS
  Filled 2023-02-17: qty 300

## 2023-02-17 MED ORDER — ACETAMINOPHEN 500 MG PO TABS
1000.0000 mg | ORAL_TABLET | Freq: Four times a day (QID) | ORAL | Status: DC
  Administered 2023-02-17 – 2023-02-18 (×3): 1000 mg via ORAL
  Filled 2023-02-17 (×4): qty 2

## 2023-02-17 NOTE — Anesthesia Post-op Follow-up Note (Signed)
  Anesthesia Pain Follow-up Note  Patient: [name]  Day #: 1  Date of Follow-up: 02/17/2023 Time: 8:00 AM  Last Vitals:  Vitals:   02/16/23 2322 02/17/23 0700  BP: 129/75   Pulse: 73   Resp: 18   Temp: 36.7 C   SpO2: 100% 93%    Level of Consciousness: alert  Pain: none   Side Effects:None  Catheter Site Exam:clean, dry, no drainage     Plan: D/C from anesthesia care at surgeon's request  Elmarie Mainland

## 2023-02-17 NOTE — Progress Notes (Signed)
Bladder scanner shows in bladder post void. Patient stated she "feels like I still have more urine".

## 2023-02-17 NOTE — Anesthesia Postprocedure Evaluation (Signed)
Anesthesia Post Note  Patient: [name]  Procedure(s) Performed: REPEAT CESAREAN SECTION  Patient location during evaluation: Mother Baby Anesthesia Type: Spinal Level of consciousness: oriented and awake and alert Pain management: pain level controlled Vital Signs Assessment: post-procedure vital signs reviewed and stable Respiratory status: spontaneous breathing and respiratory function stable Cardiovascular status: blood pressure returned to baseline and stable Postop Assessment: no headache, no backache, no apparent nausea or vomiting and able to ambulate Anesthetic complications: no  No notable events documented.   Last Vitals:  Vitals:   02/16/23 2322 02/17/23 0700  BP: 129/75   Pulse: 73   Resp: 18   Temp: 36.7 C   SpO2: 100% 93%    Last Pain:  Vitals:   02/17/23 0400  TempSrc:   PainSc: Asleep                 [name]

## 2023-02-17 NOTE — Progress Notes (Signed)
Postop Day  1  Subjective: 22 y.o. G2P2001 postpartum day #1 status post repeat cesarean section. She is ambulating, is tolerating po. She had urinary retention yesterday and a foley catheter was placed and removed this morning. She is due to void. Her pain is well controlled on PO pain medications. Her lochia is less than menses.  Objective: BP 120/74 (BP Location: Right Arm)   Pulse 85   Temp 98.6 F (37 C) (Oral)   Resp 18   Ht 5\' 9"  (1.753 m)   Wt 96.6 kg   LMP 05/19/2022   SpO2 100%   Breastfeeding Unknown   BMI 31.45 kg/m   Vitals:   02/16/23 0945 02/16/23 1000 02/16/23 1015 02/16/23 1030  BP: 129/76 134/70 137/66 136/66   02/16/23 1045 02/16/23 1108 02/16/23 1205 02/16/23 1303  BP: (!) 112/96 139/75 128/79 128/77   02/16/23 1630 02/16/23 1913 02/16/23 2322 02/17/23 0840  BP: 130/76 119/70 129/75 120/74      Physical Exam:  General: alert, cooperative, and no distress Breasts: soft/nontender Pulm: nl effort Abdomen: soft, non-tender, active bowel sounds Uterine Fundus: firm Incision: no significant drainage, covered with occlusive OP Site Perineum: minimal edema, intact Lochia: appropriate DVT Evaluation: No evidence of DVT seen on physical exam.  Recent Labs    02/16/23 0515 02/17/23 0527  HGB 11.2* 8.6*  HCT 34.2* 26.3*  WBC 9.0 9.1  PLT 256 188    Assessment/Plan: 22 y.o. G2P2001 postpartum day # 1  1. Continue routine postpartum care  2. Infant feeding status: breast and formula feeding -Lactation consult PRN for breastfeeding   3. Contraception plan:  Undecided   4. Acute blood loss anemia - clinically significant.  -Hemodynamically stable and asymptomatic -Intervention: IV iron transfusion with venofer ordered   5. Immunization status:   all immunizations up to date  6. Urinary retention: foley catheter removed this morning -Will measure voids x 2   7. Preeclampsia without severe features:  -Several mild range BP's on admission with  elevated urine PCR -BP's PP have been normal  -Asymptomatic  -Will continue to monitor BP's   Disposition: continue inpatient postpartum care    LOS: 1 day   Gustavo Lah, CNM 02/17/2023, 9:28 AM   ----- Margaretmary Eddy  Certified Nurse Midwife [facility]

## 2023-02-18 MED ORDER — IBUPROFEN 600 MG PO TABS: 600.0000 mg | ORAL_TABLET | Freq: Four times a day (QID) | ORAL | Status: DC | PRN

## 2023-02-18 MED ORDER — FERROUS SULFATE 325 (65 FE) MG PO TABS: 325.0000 mg | ORAL_TABLET | Freq: Two times a day (BID) | ORAL | Status: AC

## 2023-02-18 MED ORDER — OXYCODONE HCL 5 MG PO TABS: 5.0000 mg | ORAL_TABLET | Freq: Four times a day (QID) | ORAL | 0 refills | Status: DC | PRN

## 2023-02-18 NOTE — Progress Notes (Signed)
Patient discharged home with family.  Discharge instructions, when to follow up, and prescriptions reviewed with patient.  Patient verbalized understanding. Patient will be escorted out by auxiliary.   

## 2024-02-29 ENCOUNTER — Emergency Department

## 2024-02-29 ENCOUNTER — Other Ambulatory Visit: Payer: Self-pay

## 2024-02-29 ENCOUNTER — Emergency Department
Admission: EM | Admit: 2024-02-29 | Discharge: 2024-02-29 | Disposition: A | Discharge status: Home / Self Care | Attending: Emergency Medicine | Admitting: Emergency Medicine

## 2024-02-29 DIAGNOSIS — R102 Pelvic and perineal pain unspecified side: Secondary | ICD-10-CM

## 2024-02-29 DIAGNOSIS — N83202 Unspecified ovarian cyst, left side: Secondary | ICD-10-CM | POA: Insufficient documentation

## 2024-02-29 LAB — COMPREHENSIVE METABOLIC PANEL WITH GFR
ALT: 14 U/L (ref 0–44)
AST: 16 U/L (ref 15–41)
Albumin: 3.8 g/dL (ref 3.5–5.0)
Alkaline Phosphatase: 50 U/L (ref 38–126)
Anion gap: 7 (ref 5–15)
BUN: 14 mg/dL (ref 6–20)
CO2: 26 mmol/L (ref 22–32)
Calcium: 9.1 mg/dL (ref 8.9–10.3)
Chloride: 104 mmol/L (ref 98–111)
Creatinine, Ser: 0.78 mg/dL (ref 0.44–1.00)
GFR, Estimated: >60 mL/min (ref >60)
Glucose, Bld: 94 mg/dL (ref 70–99)
Potassium: 3.6 mmol/L (ref 3.5–5.1)
Sodium: 137 mmol/L (ref 135–145)
Total Bilirubin: 0.7 mg/dL (ref 0.0–1.2)
Total Protein: 7 g/dL (ref 6.5–8.1)

## 2024-02-29 LAB — URINALYSIS, ROUTINE W REFLEX MICROSCOPIC
Bilirubin Urine: NEGATIVE
Glucose, UA: NEGATIVE mg/dL
Hgb urine dipstick: NEGATIVE
Ketones, ur: NEGATIVE mg/dL
Leukocytes,Ua: NEGATIVE
Nitrite: NEGATIVE
Protein, ur: NEGATIVE mg/dL
Specific Gravity, Urine: 1.02 (ref 1.005–1.030)
pH: 6 (ref 5.0–8.0)

## 2024-02-29 LAB — CBC
HCT: 41.3 % (ref 36.0–46.0)
Hemoglobin: 13.4 g/dL (ref 12.0–15.0)
MCH: 28.1 pg (ref 26.0–34.0)
MCHC: 32.4 g/dL (ref 30.0–36.0)
MCV: 86.6 fL (ref 80.0–100.0)
Platelets: 307 K/uL (ref 150–400)
RBC: 4.77 MIL/uL (ref 3.87–5.11)
RDW: 11.8 % (ref 11.5–15.5)
WBC: 9 K/uL (ref 4.0–10.5)
nRBC: 0 % (ref 0.0–0.2)

## 2024-02-29 LAB — CHLAMYDIA/NGC RT PCR (ARMC ONLY)
Chlamydia Tr: NOT DETECTED
N gonorrhoeae: NOT DETECTED

## 2024-02-29 LAB — LIPASE, BLOOD: Lipase: 34 U/L (ref 11–51)

## 2024-02-29 LAB — POC URINE PREG, ED: Preg Test, Ur: NEGATIVE

## 2024-02-29 MED ORDER — ONDANSETRON 4 MG PO TBDP
4.0000 mg | ORAL_TABLET | Freq: Once | ORAL | Status: AC
  Administered 2024-02-29: 4 mg via ORAL
  Filled 2024-02-29: qty 1

## 2024-02-29 MED ORDER — ONDANSETRON 4 MG PO TBDP: 4.0000 mg | ORAL_TABLET | Freq: Four times a day (QID) | ORAL | 0 refills | Status: AC | PRN

## 2024-02-29 MED ORDER — IBUPROFEN 800 MG PO TABS: 800.0000 mg | ORAL_TABLET | Freq: Three times a day (TID) | ORAL | 0 refills | Status: AC | PRN

## 2024-02-29 NOTE — ED Triage Notes (Signed)
 Pt ambulatory into triage with c/o abdominal pain radiating to the back. Pt states that she was seen at [facility] and they prescribed antibiotics but was unable to pick them up. Pt states that the pain has gotten worse and is causing nausea.

## 2024-02-29 NOTE — ED Provider Notes (Signed)
 [facility] Provider Note    Event Date/Time   First MD Initiated Contact with Patient 02/29/24 [phone]     (approximate)   History   Abdominal Pain   HPI  [name] is a 23 y.o. female with history of anxiety, history of opiate use disorder, history of trichomonas and chlamydia who presents to the emergency department with intermittent pelvic pain for the past few days.  States she had intercourse with her husband 4 to 5 days ago and noticed afterwards she had some watery, bloody discharge.  States that normally after she has intercourse she develops a urinary tract infection so she initially thought that is what this could be.  Went to urgent care and urinalysis was unremarkable and culture is pending.  She had a negative wet prep that was negative for yeast, Gardnerella and trichomonas.  She is sexually active with just her husband and has low concern for STI.  She has had some nausea but no vomiting.  No diarrhea.  Denies dysuria, hematuria, urinary urgency or frequency.  She has had previous C-section.  She states pain does radiate from her lower back into the lower abdomen.  No history of kidney stones.   History provided by patient.    Past Medical History:  Diagnosis Date   Anxiety    History of chlamydia    treated   History of trichomoniasis    treated   History of urinary tract infection    Irregular heartbeat    Opioid abuse (HCC) 11/14/2019   Trichomonal vaginitis 12/16/2016   Vasovagal syncope     Past Surgical History:  Procedure Laterality Date   CESAREAN SECTION N/A 07/17/2021   Procedure: CESAREAN SECTION;  Surgeon: Lovetta, Debby PARAS, MD;  Location: [facility] ORS;  Service: Obstetrics;  Laterality: N/A;   CESAREAN SECTION N/A 02/16/2023   Procedure: REPEAT CESAREAN SECTION;  Surgeon: Leonce Garnette BIRCH, MD;  Location: [facility] ORS;  Service: Obstetrics;  Laterality: N/A;   cicatrix removal     REPEAT CESAREAN SECTION       MEDICATIONS:  Prior to Admission medications   Medication Sig Start Date End Date Taking? Authorizing Provider  acetaminophen  (TYLENOL ) 500 MG tablet Take 2 tablets (1,000 mg total) by mouth every 6 (six) hours. 07/19/21   Tanda Edsel Fuller, CNM  ascorbic acid (VITAMIN C) 500 MG tablet Take 500 mg by mouth daily.    [provider]  calcium  carbonate (TUMS - DOSED IN MG ELEMENTAL CALCIUM ) 500 MG chewable tablet Chew 1 tablet by mouth daily as needed for indigestion or heartburn. Patient not taking: Reported on 02/10/2023    [provider]  ferrous sulfate  325 (65 FE) MG tablet Take 1 tablet (325 mg total) by mouth 2 (two) times daily with a meal. 02/18/23   Myron Nest, CNM  ibuprofen  (ADVIL ) 600 MG tablet Take 1 tablet (600 mg total) by mouth every 6 (six) hours as needed. 02/18/23   Myron Nest, CNM  oxyCODONE  (OXY IR/ROXICODONE ) 5 MG immediate release tablet Take 1 tablet (5 mg total) by mouth every 6 (six) hours as needed for breakthrough pain. 02/18/23   Myron Nest, CNM  Prenatal Vit-Fe Fumarate-FA (PRENATAL MULTIVITAMIN) TABS tablet Take 1 tablet by mouth daily at 12 noon. 07/19/21   Tanda Edsel Fuller, CNM    Physical Exam   Triage Vital Signs: ED Triage Vitals [02/29/24 0336]  Encounter Vitals Group     BP      Girls Systolic  BP Percentile      Girls Diastolic BP Percentile      Boys Systolic BP Percentile      Boys Diastolic BP Percentile      Pulse Rate 74     Resp 18     Temp 98 F (36.7 C)     Temp src      SpO2 99 %     Weight 195 lb (88.5 kg)     Height 5' 9 (1.753 m)     Head Circumference      Peak Flow      Pain Score 6     Pain Loc      Pain Education      Exclude from Growth Chart     Most recent vital signs: Vitals:   02/29/24 0336 02/29/24 0443  BP:  127/78  Pulse: 74 62  Resp: 18 16  Temp: 98 F (36.7 C)   SpO2: 99% 100%    CONSTITUTIONAL: Alert, responds appropriately to questions. Well-appearing;  well-nourished HEAD: Normocephalic, atraumatic EYES: Conjunctivae clear, pupils appear equal, sclera nonicteric ENT: normal nose; moist mucous membranes NECK: Supple, normal ROM CARD: RRR; S1 and S2 appreciated RESP: Normal chest excursion without splinting or tachypnea; breath sounds clear and equal bilaterally; no wheezes, no rhonchi, no rales, no hypoxia or respiratory distress, speaking full sentences ABD/GI: Non-distended; soft, non-tender, no rebound, no guarding, no peritoneal signs, no tenderness at McBurney's point BACK: The back appears normal, no CVA tenderness, no midline spinal tenderness or step-off or deformity EXT: Normal ROM in all joints; no deformity noted, no edema SKIN: Normal color for age and race; warm; no rash on exposed skin NEURO: Moves all extremities equally, normal speech PSYCH: The patient's mood and manner are appropriate.   ED Results / Procedures / Treatments   LABS: (all labs ordered are listed, but only abnormal results are displayed) Labs Reviewed  URINALYSIS, ROUTINE W REFLEX MICROSCOPIC - Abnormal; Notable for the following components:      Result Value   Color, Urine YELLOW (*)    APPearance HAZY (*)    All other components within normal limits  CHLAMYDIA/NGC RT PCR ([facility] ONLY)            LIPASE, BLOOD  COMPREHENSIVE METABOLIC PANEL WITH GFR  CBC  POC URINE PREG, ED     EKG:  EKG Interpretation Date/Time:    Ventricular Rate:    PR Interval:    QRS Duration:    QT Interval:    QTC Calculation:   R Axis:      Text Interpretation:           RADIOLOGY: My personal review and interpretation of imaging: Left ovarian cyst.  No torsion.  I have personally reviewed all radiology reports.   US  PELVIC TRANSABD W/PELVIC DOPPLER Result Date: 02/29/2024 EXAM: US  Pelvis, Complete Transvaginal and Transabdominal with Doppler 02/29/2024 05:34:16 AM TECHNIQUE: Transabdominal and transvaginal pelvic duplex ultrasound using B-mode/gray  scaled imaging with Doppler spectral analysis and color flow was obtained. COMPARISON: 09/10/2017 CLINICAL HISTORY: Pelvic pain [id number]. FINDINGS: UTERUS: The uterus is anteverted measuring 10.2 x 4.3 x 5.9 cm. The uterine volume is equal to 134.7 cc. Uterus demonstrates normal myometrial echotexture. ENDOMETRIAL STRIPE: The endometrium appears normal, measuring 4.3 mm. RIGHT OVARY: The right ovary is normal, measuring 4.5 x 2.4 x 2.4 cm with a volume of 13.5 cc. There is normal arterial and venous Doppler flow. LEFT OVARY: The left ovary measures 5.6 x 3.0 x  4.3 cm. An involuting cyst is identified within the left ovary measuring 2.0 x 2.7 x 2.4 cm. There is normal arterial and venous Doppler flow. FREE FLUID: A small to moderate amount of free fluid noted in the pelvis, which may reflect ruptured, possibly from the left ovary. IMPRESSION: 1. Findings suggest recent rupture of a left ovarian cyst with small to moderate pelvic free fluid. 2. Involuting left ovarian cyst measuring up to 2.7 cm. 3. No signs of ovarian torsion. Electronically signed by: Waddell Calk MD 02/29/2024 06:15 AM EDT RP Workstation: [id number]     PROCEDURES:  Critical Care performed: No      Procedures    IMPRESSION / MDM / ASSESSMENT AND PLAN / ED COURSE  I reviewed the triage vital signs and the nursing notes.    Patient here with lower back pain radiating into the lower abdomen.    The patient is on the cardiac monitor to evaluate for evidence of arrhythmia and/or significant heart rate changes.   DIFFERENTIAL DIAGNOSIS (includes but not limited to):   UTI, ovarian cyst, endometriosis, ovarian torsion, STI, low suspicion clinically for kidney stone, pyelonephritis, appendicitis   Patient's presentation is most consistent with acute presentation with potential threat to life or bodily function.   PLAN: Labs, urine pending.  Will obtain transvaginal ultrasound with Doppler.  Will give Zofran .  Offered pain  medication which she declines.   MEDICATIONS GIVEN IN ED: Medications  ondansetron  (ZOFRAN -ODT) disintegrating tablet 4 mg (4 mg Oral Given 02/29/24 0440)     ED COURSE: Labs show no leukocytosis.  Normal creatinine, LFTs and lipase.  Urine does not show any sign of bladder infection.  Pregnancy test negative.  Gonorrhea and Chlamydia negative.  Transvaginal ultrasound reviewed and interpreted by myself and the radiologist and shows moderate pelvic free fluid but no hemoperitoneum from a ruptured left ovarian cyst that is currently involuting.  No sign of torsion.  Abdominal exam benign, hemodynamically stable.  Discussed findings with patient and she feels reassured.  Recommended Tylenol , Motrin .  Will discharge with Zofran .  Will give OB/GYN follow-up.  At this time, I do not feel there is any life-threatening condition present. I reviewed all nursing notes, vitals, pertinent previous records.  All lab and urine results, EKGs, imaging ordered have been independently reviewed and interpreted by myself.  I reviewed all available radiology reports from any imaging ordered this visit.  Based on my assessment, I feel the patient is safe to be discharged home without further emergent workup and can continue workup as an outpatient as needed. Discussed all findings, treatment plan as well as usual and customary return precautions.  They verbalize understanding and are comfortable with this plan.  Outpatient follow-up has been provided as needed.  All questions have been answered.    CONSULTS:  none   OUTSIDE RECORDS REVIEWED: Reviewed urgent care notes.       FINAL CLINICAL IMPRESSION(S) / ED DIAGNOSES   Final diagnoses:  Pelvic pain  Ruptured cyst of left ovary     Rx / DC Orders   ED Discharge Orders          Ordered    ibuprofen  (ADVIL ) 800 MG tablet  Every 8 hours PRN        02/29/24 0623    ondansetron  (ZOFRAN -ODT) 4 MG disintegrating tablet  Every 6 hours PRN        02/29/24  0623             Note:  This document was prepared using Dragon voice recognition software and may include unintentional dictation errors.   Eldor Conaway, Josette SAILOR, DO 02/29/24 [phone]

## 2024-02-29 NOTE — Discharge Instructions (Signed)
 You may alternate over the counter Tylenol 1000 mg every 6 hours as needed for pain, fever and Ibuprofen 800 mg every 6-8 hours as needed for pain, fever.  Please take Ibuprofen with food.  Do not take more than 4000 mg of Tylenol (acetaminophen) in a 24 hour period.

## 2024-02-29 NOTE — ED Notes (Signed)
 US  at bedside.

## 2024-03-07 ENCOUNTER — Emergency Department: Admission: EM | Admit: 2024-03-07 | Discharge: 2024-03-07 | Disposition: A | Discharge status: Home / Self Care

## 2024-03-07 ENCOUNTER — Other Ambulatory Visit: Payer: Self-pay

## 2024-03-07 DIAGNOSIS — N939 Abnormal uterine and vaginal bleeding, unspecified: Secondary | ICD-10-CM | POA: Diagnosis present

## 2024-03-07 LAB — CBC WITH DIFFERENTIAL/PLATELET
Abs Immature Granulocytes: 0.01 K/uL (ref 0.00–0.07)
Basophils Absolute: 0 K/uL (ref 0.0–0.1)
Basophils Relative: 1 %
Eosinophils Absolute: 0.2 K/uL (ref 0.0–0.5)
Eosinophils Relative: 2 %
HCT: 38.7 % (ref 36.0–46.0)
Hemoglobin: 12.7 g/dL (ref 12.0–15.0)
Immature Granulocytes: 0 %
Lymphocytes Relative: 29 %
Lymphs Abs: 2.3 K/uL (ref 0.7–4.0)
MCH: 28.2 pg (ref 26.0–34.0)
MCHC: 32.8 g/dL (ref 30.0–36.0)
MCV: 85.8 fL (ref 80.0–100.0)
Monocytes Absolute: 0.6 K/uL (ref 0.1–1.0)
Monocytes Relative: 8 %
Neutro Abs: 4.8 K/uL (ref 1.7–7.7)
Neutrophils Relative %: 60 %
Platelets: 301 K/uL (ref 150–400)
RBC: 4.51 MIL/uL (ref 3.87–5.11)
RDW: 11.9 % (ref 11.5–15.5)
WBC: 7.9 K/uL (ref 4.0–10.5)
nRBC: 0 % (ref 0.0–0.2)

## 2024-03-07 LAB — URINALYSIS, ROUTINE W REFLEX MICROSCOPIC
Bacteria, UA: NONE SEEN
Bilirubin Urine: NEGATIVE
Glucose, UA: NEGATIVE mg/dL
Ketones, ur: NEGATIVE mg/dL
Leukocytes,Ua: NEGATIVE
Nitrite: NEGATIVE
Protein, ur: 30 mg/dL — AB
RBC / HPF: >50 RBC/hpf (ref 0–5)
Specific Gravity, Urine: 1.016 (ref 1.005–1.030)
pH: 8 (ref 5.0–8.0)

## 2024-03-07 LAB — POC URINE PREG, ED: Preg Test, Ur: NEGATIVE

## 2024-03-07 NOTE — ED Notes (Signed)
 Two greens and one purple sent to lab.

## 2024-03-07 NOTE — Discharge Instructions (Signed)
 You were seen in the emergency department with concerns for abnormal uterine bleeding.  Workup today was reassuring.  I advise pelvic rest until you can follow-up with your gynecologist.  Please call and make an appointment tomorrow.  Return with any acutely worsening symptoms or any other emergency. -- RETURN PRECAUTIONS & AFTERCARE: (ENGLISH) RETURN PRECAUTIONS: Return immediately to the emergency department or see/call your doctor if you feel worse, weak or have changes in speech or vision, are short of breath, have fever, vomiting, pain, bleeding or dark stool, trouble urinating or any new issues. Return here or see/call your doctor if not improving as expected for your suspected condition. FOLLOW-UP CARE: Call your doctor and/or any doctors we referred you to for more advice and to make an appointment. Do this today, tomorrow or after the weekend. Some doctors only take PPO insurance so if you have HMO insurance you may want to contact your HMO or your regular doctor for referral to a specialist within your plan. Either way tell the doctor's office that it was a referral from the emergency department so you get the soonest possible appointment.  YOUR TEST RESULTS: Take result reports of any blood or urine tests, imaging tests and EKG's to your doctor and any referral doctor. Have any abnormal tests repeated. Your doctor or a referral doctor can let you know when this should be done. Also make sure your doctor contacts this hospital to get any test results that are not currently available such as cultures or special tests for infection and final imaging reports, which are often not available at the time you leave the ER but which may list additional important findings that are not documented on the preliminary report. BLOOD PRESSURE: If your blood pressure was greater than 120/80 have your blood pressure rechecked within 1 to 2 weeks. MEDICATION SIDE EFFECTS: Do not drive, walk, bike, take the bus, etc.  if you have received or are being prescribed any sedating medications such as those for pain or anxiety or certain antihistamines like Benadryl . If you have been give one of these here get a taxi home or have a friend drive you home. Ask your pharmacist to counsel you on potential side effects of any new medication

## 2024-03-07 NOTE — ED Provider Notes (Signed)
 [name] Provider Note    Event Date/Time   First MD Initiated Contact with Patient 03/07/24 2314     (approximate)   History   Vaginal Bleeding   HPI  [name] is a 23 y.o. female with a past medical history of anxiety, opioid use disorder, prior STIs who presents to the emergency department with vaginal bleeding.  Patient states that she had intercourse with her husband yesterday and subsequently developed vaginal bleeding.  She was seen in our emergency department on 10/12 and had an ovarian cyst.  Denies any new exposures to STIs and declines any STI testing today.  Denies any lightheadedness or syncopal episodes or chest pain or shortness of breath.  She presents primarily as the vaginal bleeding is more than what she is used to.  She has not followed up with gynecology since her last ED visit but does have a gynecologist at [facility]      Physical Exam   Triage Vital Signs: ED Triage Vitals  Encounter Vitals Group     BP 03/07/24 2233 (!) 154/90     Girls Systolic BP Percentile --      Girls Diastolic BP Percentile --      Boys Systolic BP Percentile --      Boys Diastolic BP Percentile --      Pulse Rate 03/07/24 2233 82     Resp 03/07/24 2233 20     Temp 03/07/24 2233 98.3 F (36.8 C)     Temp Source 03/07/24 2233 Oral     SpO2 03/07/24 2233 100 %     Weight --      Height --      Head Circumference --      Peak Flow --      Pain Score 03/07/24 2230 0     Pain Loc --      Pain Education --      Exclude from Growth Chart --     Most recent vital signs: Vitals:   03/07/24 2315 03/07/24 2354  BP: (!) 147/90 (!) 143/105  Pulse: 74 74  Resp:  16  Temp:  98.4 F (36.9 C)  SpO2: 100% 100%    Nursing Triage Note reviewed. Vital signs reviewed and patients oxygen saturation is normoxic  General: Patient is well nourished, well developed, awake and alert, resting comfortably in no acute distress Head: Normocephalic  and atraumatic Eyes: Normal inspection, extraocular muscles intact, no conjunctival pallor Ear, nose, throat: Normal external exam Neck: Normal range of motion Respiratory: Patient is in no respiratory distress, lungs CTAB Cardiovascular: Patient is not tachycardic, RRR without murmur appreciated GI: Abd SNT with no guarding or rebound  Back: Normal inspection of the back with good strength and range of motion throughout all ext Extremities: pulses intact with good cap refills, no LE pitting edema or calf tenderness Neuro: The patient is alert and oriented to person, place, and time, appropriately conversive, with 5/5 bilat UE/LE strength, no gross motor or sensory defects noted. Coordination appears to be adequate. Skin: Warm, dry, and intact Psych: normal mood and affect, no SI or HI  With a chaperone present (RN tamara) and the patient's permission, a pelvic exam was performed:  PELVIC EXAM External genitalia unremarkable. Speculum exam with normal.  No visible vaginal wall abnormalities the tail Vaginal wall mucosa is unremarkable. Cervix visualized and is unremarkable (closed in appearance, with dark blood oozing from os slowly) Bimanual exam without cervical motion tenderness,  adnexal tenderness or any masses appreciated.    ED Results / Procedures / Treatments   Labs (all labs ordered are listed, but only abnormal results are displayed) Labs Reviewed  URINALYSIS, ROUTINE W REFLEX MICROSCOPIC - Abnormal; Notable for the following components:      Result Value   Color, Urine YELLOW (*)    APPearance CLOUDY (*)    Hgb urine dipstick LARGE (*)    Protein, ur 30 (*)    All other components within normal limits  POC URINE PREG, ED - Normal  CBC WITH DIFFERENTIAL/PLATELET     EKG None  RADIOLOGY None    PROCEDURES:  Critical Care performed: No  Procedures   MEDICATIONS ORDERED IN ED: Medications - No data to display   IMPRESSION / MDM / ASSESSMENT AND PLAN  / ED COURSE                                Differential diagnosis includes, but is not limited to, acute anemia, abnormal uterine bleeding, UTI, pregnancy  ED course: I reviewed the workup completed less than 8 days ago at our facility including the pelvic ultrasound at that time.  Patient denies any new exposures and declines any STI testing which I think is appropriate given her previous history.  Her urine pregnancy test was not elevated.  She is not acutely anemic (in fact better than previous).  Urinalysis was not consistent with UTI.  Pelvic exam demonstrated no lesions.  I did consider possibly repeating the pelvic ultrasound but given her completely normal bimanual exam I do not think it is necessary at this time.  She was encouraged to call her gynecology team tomorrow.   Clinical Course as of 03/08/24 0248  Sun Mar 07, 2024  2317 HCT: 38.7 Unremarkable [HD]  2323 Urinalysis, Routine w reflex microscopic -Urine, Clean Catch(!) Indeterminate for UTI I think it is grossly contaminated by blood [HD]  2323 Preg Test, Ur: Negative Negative [HD]    Clinical Course User Index [HD] Nicholaus Rolland BRAVO, MD   At time of discharge there is no evidence of acute life, limb, vision, or fertility threat. Patient has stable vital signs, pain is well controlled, patient is ambulatory and p.o. tolerant.  Discharge instructions were completed using the EPIC system. I would refer you to those at this time. All warnings prescriptions follow-up etc. were discussed in detail with the patient. Patient indicates understanding and is agreeable with this plan. All questions answered.  Patient is made aware that they may return to the emergency department for any worsening or new condition or for any other emergency.  -- Risk: 5 This patient has a high risk of morbidity due to further diagnostic testing or treatment. Rationale: This patient's evaluation and management involve a high risk of morbidity due to  the potential severity of presenting symptoms, need for diagnostic testing, and/or initiation of treatment that may require close monitoring. The differential includes conditions with potential for significant deterioration or requiring escalation of care. Treatment decisions in the ED, including medication administration, procedural interventions, or disposition planning, reflect this level of risk. COPA: 5 The patient has the following acute or chronic illness/injury that poses a possible threat to life or bodily function: [X] : The patient has a potentially serious acute condition or an acute exacerbation of a chronic illness requiring urgent evaluation and management in the Emergency Department. The clinical presentation necessitates immediate consideration of life-threatening  or function-threatening diagnoses, even if they are ultimately ruled out.   FINAL CLINICAL IMPRESSION(S) / ED DIAGNOSES   Final diagnoses:  Vaginal bleeding     Rx / DC Orders   ED Discharge Orders     None        Note:  This document was prepared using Dragon voice recognition software and may include unintentional dictation errors.   Nicholaus Rolland BRAVO, MD 03/08/24 [phone]

## 2024-03-07 NOTE — ED Triage Notes (Signed)
 Pt stated she was having sex with husband and experienced moderate amount of vaginal bleeding with small clots the next morning. Verbalizes discomfort. Stated sex was not painful. Last menstrual cycle was two weeks ago.
